# Patient Record
Sex: Female | Born: 1950 | Race: Black or African American | Hispanic: No | Smoking: Former smoker
Health system: Southern US, Community
[De-identification: ages and names within clinical notes are randomized; demographics above are authoritative.]

## PROBLEM LIST (undated history)

## (undated) DIAGNOSIS — Z923 Personal history of irradiation: Secondary | ICD-10-CM

## (undated) DIAGNOSIS — K219 Gastro-esophageal reflux disease without esophagitis: Secondary | ICD-10-CM

## (undated) DIAGNOSIS — Z9889 Other specified postprocedural states: Secondary | ICD-10-CM

## (undated) DIAGNOSIS — F419 Anxiety disorder, unspecified: Secondary | ICD-10-CM

## (undated) DIAGNOSIS — E785 Hyperlipidemia, unspecified: Secondary | ICD-10-CM

## (undated) DIAGNOSIS — M1990 Unspecified osteoarthritis, unspecified site: Secondary | ICD-10-CM

## (undated) DIAGNOSIS — C50919 Malignant neoplasm of unspecified site of unspecified female breast: Secondary | ICD-10-CM

## (undated) DIAGNOSIS — H269 Unspecified cataract: Secondary | ICD-10-CM

## (undated) DIAGNOSIS — R112 Nausea with vomiting, unspecified: Secondary | ICD-10-CM

## (undated) DIAGNOSIS — Z9221 Personal history of antineoplastic chemotherapy: Secondary | ICD-10-CM

## (undated) DIAGNOSIS — R05 Cough: Secondary | ICD-10-CM

## (undated) DIAGNOSIS — G4730 Sleep apnea, unspecified: Secondary | ICD-10-CM

## (undated) DIAGNOSIS — T7840XA Allergy, unspecified, initial encounter: Secondary | ICD-10-CM

## (undated) DIAGNOSIS — I10 Essential (primary) hypertension: Secondary | ICD-10-CM

## (undated) HISTORY — DX: Unspecified cataract: H26.9

## (undated) HISTORY — PX: COLONOSCOPY: SHX174

## (undated) HISTORY — PX: BACK SURGERY: SHX140

## (undated) HISTORY — DX: Sleep apnea, unspecified: G47.30

## (undated) HISTORY — DX: Essential (primary) hypertension: I10

## (undated) HISTORY — PX: BREAST BIOPSY: SHX20

## (undated) HISTORY — DX: Malignant neoplasm of unspecified site of unspecified female breast: C50.919

## (undated) HISTORY — DX: Anxiety disorder, unspecified: F41.9

## (undated) HISTORY — DX: Hyperlipidemia, unspecified: E78.5

## (undated) HISTORY — DX: Gastro-esophageal reflux disease without esophagitis: K21.9

## (undated) HISTORY — PX: EYE SURGERY: SHX253

## (undated) HISTORY — DX: Unspecified osteoarthritis, unspecified site: M19.90

## (undated) HISTORY — DX: Allergy, unspecified, initial encounter: T78.40XA

---
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
## 1980-03-25 HISTORY — PX: SALPINGOOPHORECTOMY: SHX82

## 1980-03-25 HISTORY — PX: APPENDECTOMY: SHX54

## 1993-03-25 HISTORY — PX: OTHER SURGICAL HISTORY: SHX169

## 1994-03-25 HISTORY — PX: OTHER SURGICAL HISTORY: SHX169

## 1998-03-25 DIAGNOSIS — C50919 Malignant neoplasm of unspecified site of unspecified female breast: Secondary | ICD-10-CM

## 1998-03-25 DIAGNOSIS — Z923 Personal history of irradiation: Secondary | ICD-10-CM

## 1998-03-25 DIAGNOSIS — Z9221 Personal history of antineoplastic chemotherapy: Secondary | ICD-10-CM

## 1998-03-25 HISTORY — DX: Personal history of irradiation: Z92.3

## 1998-03-25 HISTORY — DX: Personal history of antineoplastic chemotherapy: Z92.21

## 1998-03-25 HISTORY — DX: Malignant neoplasm of unspecified site of unspecified female breast: C50.919

## 1999-03-26 HISTORY — PX: BREAST LUMPECTOMY: SHX2

## 2000-07-01 ENCOUNTER — Other Ambulatory Visit: Admission: RE | Admit: 2000-07-01 | Discharge: 2000-07-01 | Payer: Self-pay | Admitting: Obstetrics and Gynecology

## 2002-07-12 ENCOUNTER — Other Ambulatory Visit: Admission: RE | Admit: 2002-07-12 | Discharge: 2002-07-12 | Payer: Self-pay | Admitting: Obstetrics and Gynecology

## 2006-03-25 HISTORY — PX: LUMBAR DISC SURGERY: SHX700

## 2006-11-20 ENCOUNTER — Ambulatory Visit (HOSPITAL_COMMUNITY): Admission: RE | Admit: 2006-11-20 | Discharge: 2006-11-21 | Payer: Self-pay | Admitting: Specialist

## 2007-04-13 ENCOUNTER — Emergency Department (HOSPITAL_COMMUNITY): Admission: EM | Admit: 2007-04-13 | Discharge: 2007-04-13 | Payer: Self-pay | Admitting: Emergency Medicine

## 2007-08-26 ENCOUNTER — Other Ambulatory Visit: Admission: RE | Admit: 2007-08-26 | Discharge: 2007-08-26 | Payer: Self-pay | Admitting: Obstetrics and Gynecology

## 2009-03-24 ENCOUNTER — Emergency Department (HOSPITAL_COMMUNITY): Admission: EM | Admit: 2009-03-24 | Discharge: 2009-03-24 | Payer: Self-pay | Admitting: Emergency Medicine

## 2010-08-07 NOTE — Op Note (Signed)
Jill Mcguire, Jill Mcguire            ACCOUNT NO.:  [id number]   MEDICAL RECORD NO.:  [id number]          PATIENT TYPE:  AMB   LOCATION:  DAY                          FACILITY:  [facility]   PHYSICIAN:  Jene Every, M.D.    DATE OF BIRTH:  27-Feb-1951   DATE OF PROCEDURE:  11/20/2006  DATE OF DISCHARGE:                               OPERATIVE REPORT   PREOPERATIVE DIAGNOSIS:  Spinal stenosis, herniated nucleus pulposus, L5-  S1 right.   POSTOPERATIVE DIAGNOSIS:  Spinal stenosis, herniated nucleus pulposus,  L5-S1 right.   PROCEDURE PERFORMED:  Lateral recess decompression and foraminotomy of  S1, microdiskectomy L5-S1.   ASSISTANT:  Roma Schanz, P.A.   BRIEF HISTORY AND INDICATION:  A 60 year old with right lower extremity  radicular pain in an S1 nerve root distribution secondary to HNP and  stenosis compressing the S1 nerve root.  It was refractory to  conservative treatment.  Positive neural tension signs, diminished  plantar flexion, indicated for decompression of the S1 nerve root.  Risks and benefits discussed including bleeding, infection, damage to  neurovascular structures, CSF leakage, epidural fibrosis, adjacent  segment disease, need for fusion in the future, anesthetic  complications, etc.   TECHNIQUE:  The patient placed in the in supine position.  After the  induction of adequate anesthesia and 1 g Kefzol, she was placed prone on  the May frame, all bony prominences well-padded.  The lumbar region  is prepped and draped in the usual sterile fashion.  One 18-gauge spinal  needle utilized to localize the L5-S1 interspace, confirmed with x-ray.  Incision was made from the spinous process of L5 to S1.  Subcutaneous  tissue was dissected.  Electrocautery was utilized to achieve  hemostasis.  The dorsal lumbar fascia identified and divided in the line  of the skin incision.  Paraspinous muscle elevated from the lamina of L5  and S1.  Operating microscope was draped  and brought in the surgical  field.   Confirmatory radiograph obtained with Penfield #4 in the interlaminar  space.  Hemilaminotomy of the caudad edge of L5 was performed with 2 and  3-mm Kerrison, detaching the ligamentum flavum.  A curette utilized to  detach the ligamentum flavum from the cephalad edge of S1.  Hemilaminotomy was then performed there with a foraminotomy of S1.  The  nerve root of S1 was identified and found to be compressing the lateral  recess, multifactorial.  We decompressed the lateral recess to the  medial border of the pedicle utilizing a 2-mm Kerrison.  Then gently  mobilized the nerve root medially, identified a focal HNP, performed an  annulotomy and a copious portion of disk material was removed from the  disk space with straight and upbiting pituitaries and further mobilized  with a nerve hook hockey stick probe.  Following the full diskectomy of  herniated material, there was good decompression of the nerve root with  at least a centimeter of excursion medial to the pedicle without  difficulty.  I then checked the foramen of L5, S1, S1, passed the hockey  stick freely beneath the thecal sac, the axilla and  the shoulder of the  root without residual disk herniation causing compression.  Disk space  copiously irrigated with antibiotic irrigation.  Inspection revealed no  evidence of CSF leakage or active bleeding.  McCullough retractor was  removed.  Paraspinous muscle inspected with no evidence of active  bleeding.  The  dorsal lumbar fascia reapproximated with #1 Vicryl  interrupted figure-of-eight sutures, the subcutaneous tissue  reapproximated with 2-0 Vicryl simple suture the skin was reapproximated  with 4-0 subcuticular Prolene and reinforced with Steri-Strips.  Sterile  dressing applied.  Placed supine on the hospital bed, extubated without difficulty and  transported to the recovery room in satisfactory condition.   The patient tolerated the  procedure well with no complications.      Jene Every, M.D.  Electronically Signed     JB/MEDQ  D:  11/20/2006  T:  11/21/2006  Job:  [id number]

## 2010-10-17 ENCOUNTER — Encounter: Payer: Self-pay | Admitting: Internal Medicine

## 2010-10-25 ENCOUNTER — Ambulatory Visit (AMBULATORY_SURGERY_CENTER): Payer: BC Managed Care – PPO | Admitting: *Deleted

## 2010-10-25 ENCOUNTER — Telehealth: Payer: Self-pay | Admitting: *Deleted

## 2010-10-25 VITALS — Ht 67.0 in | Wt 181.4 lb

## 2010-10-25 DIAGNOSIS — Z86010 Personal history of colonic polyps: Secondary | ICD-10-CM

## 2010-10-25 DIAGNOSIS — Z1211 Encounter for screening for malignant neoplasm of colon: Secondary | ICD-10-CM

## 2010-10-25 MED ORDER — PEG-KCL-NACL-NASULF-NA ASC-C 100 G PO SOLR: ORAL | Status: DC

## 2010-10-25 NOTE — Progress Notes (Signed)
Pt scheduled for colonoscopy Thursday 11/08/2010 with Dr. Juanda Chance.. She had previous colonoscopy procedures at [facility] in 2001 and 2006. She thinks she had polyps in 2001 but not in 2006.  Pt does not remember MD name that performed procedures.  Release of information form signed and given to Ronny Bacon, CMA. Ezra Sites

## 2010-10-25 NOTE — Telephone Encounter (Signed)
Pt scheduled for colonoscopy Thursday 11/08/2010 with Dr. Brodie.. She had previous colonoscopy procedures at [facility] in 2001 and 2006. She thinks she had polyps in 2001 but not in 2006.  Pt does not remember MD name that performed procedures.  Release of information form signed and given to Dottie Lener Ventresca, CMA. Kieryn Burtis  

## 2010-11-08 ENCOUNTER — Encounter: Payer: Self-pay | Admitting: Internal Medicine

## 2010-11-08 ENCOUNTER — Ambulatory Visit (AMBULATORY_SURGERY_CENTER): Payer: BC Managed Care – PPO | Admitting: Internal Medicine

## 2010-11-08 DIAGNOSIS — D128 Benign neoplasm of rectum: Secondary | ICD-10-CM

## 2010-11-08 DIAGNOSIS — Z1211 Encounter for screening for malignant neoplasm of colon: Secondary | ICD-10-CM

## 2010-11-08 DIAGNOSIS — D129 Benign neoplasm of anus and anal canal: Secondary | ICD-10-CM

## 2010-11-08 DIAGNOSIS — Z86010 Personal history of colonic polyps: Secondary | ICD-10-CM

## 2010-11-08 DIAGNOSIS — D126 Benign neoplasm of colon, unspecified: Secondary | ICD-10-CM

## 2010-11-08 MED ORDER — SODIUM CHLORIDE 0.9 % IV SOLN: 500.0000 mL | INTRAVENOUS | Status: DC

## 2010-11-08 NOTE — Patient Instructions (Signed)
See blue and green sheets for additional d/c instructions 

## 2010-11-09 ENCOUNTER — Telehealth: Payer: Self-pay | Admitting: *Deleted

## 2010-11-09 NOTE — Telephone Encounter (Signed)
No ID on voice mail.  No message left per protocol.

## 2010-11-15 ENCOUNTER — Encounter: Payer: Self-pay | Admitting: Internal Medicine

## 2010-11-21 ENCOUNTER — Other Ambulatory Visit: Payer: Self-pay | Admitting: Obstetrics and Gynecology

## 2010-11-21 ENCOUNTER — Other Ambulatory Visit (HOSPITAL_COMMUNITY)
Admission: RE | Admit: 2010-11-21 | Discharge: 2010-11-21 | Disposition: A | Discharge status: Home / Self Care | Payer: BC Managed Care – PPO | Source: Ambulatory Visit | Attending: Obstetrics and Gynecology | Admitting: Obstetrics and Gynecology

## 2010-11-21 DIAGNOSIS — Z01419 Encounter for gynecological examination (general) (routine) without abnormal findings: Secondary | ICD-10-CM | POA: Insufficient documentation

## 2010-12-14 LAB — HIV ANTIBODY (ROUTINE TESTING W REFLEX): HIV: NONREACTIVE

## 2010-12-14 LAB — ALT: ALT: 16

## 2010-12-14 LAB — HEPATITIS C ANTIBODY: HCV Ab: NEGATIVE

## 2010-12-14 LAB — HEPATITIS B SURFACE ANTIGEN: Hepatitis B Surface Ag: NEGATIVE

## 2010-12-14 LAB — AST: AST: 19

## 2011-01-04 LAB — PROTIME-INR
INR: 0.9
Prothrombin Time: 12.7

## 2011-01-04 LAB — HEMOGLOBIN AND HEMATOCRIT, BLOOD
HCT: 39.1
Hemoglobin: 13.2

## 2011-01-04 LAB — APTT: aPTT: 26

## 2012-07-02 DIAGNOSIS — C50811 Malignant neoplasm of overlapping sites of right female breast: Secondary | ICD-10-CM | POA: Insufficient documentation

## 2012-07-02 DIAGNOSIS — Z170 Estrogen receptor positive status [ER+]: Secondary | ICD-10-CM | POA: Insufficient documentation

## 2012-09-03 ENCOUNTER — Other Ambulatory Visit: Payer: Self-pay | Admitting: Obstetrics and Gynecology

## 2012-10-14 ENCOUNTER — Other Ambulatory Visit (HOSPITAL_COMMUNITY)
Admission: RE | Admit: 2012-10-14 | Discharge: 2012-10-14 | Disposition: A | Discharge status: Home / Self Care | Payer: BC Managed Care – PPO | Source: Ambulatory Visit | Attending: Obstetrics and Gynecology | Admitting: Obstetrics and Gynecology

## 2012-10-14 ENCOUNTER — Ambulatory Visit (INDEPENDENT_AMBULATORY_CARE_PROVIDER_SITE_OTHER): Payer: BC Managed Care – PPO | Admitting: Obstetrics and Gynecology

## 2012-10-14 ENCOUNTER — Encounter: Payer: Self-pay | Admitting: Obstetrics and Gynecology

## 2012-10-14 VITALS — BP 130/68 | Ht 67.0 in | Wt 182.0 lb

## 2012-10-14 DIAGNOSIS — Z01419 Encounter for gynecological examination (general) (routine) without abnormal findings: Secondary | ICD-10-CM | POA: Insufficient documentation

## 2012-10-14 DIAGNOSIS — N812 Incomplete uterovaginal prolapse: Secondary | ICD-10-CM

## 2012-10-14 DIAGNOSIS — Z1212 Encounter for screening for malignant neoplasm of rectum: Secondary | ICD-10-CM

## 2012-10-14 DIAGNOSIS — Z1151 Encounter for screening for human papillomavirus (HPV): Secondary | ICD-10-CM | POA: Insufficient documentation

## 2012-10-14 LAB — HEMOCCULT GUIAC POC 1CARD (OFFICE): Fecal Occult Blood, POC: NEGATIVE

## 2012-10-14 NOTE — Progress Notes (Signed)
Patient ID: Jill Mcguire, female   DOB: 09/13/1950, 62 y.o.   MRN: [id number] Pt here today for an annual exam and questionable prolapse

## 2012-10-14 NOTE — Progress Notes (Signed)
  Assessment:  Normal Gyn Examsecond degreee ut descensus   Plan:  1. pap smear done, next pap due 3 yr 2. return 2-4 wk for pessary fitting delay surgery discussion til later, p retrirement  Subjective:  Jill Mcguire is a 62 y.o. female No obstetric history on file. who presents for annual exam.  Has noted firm mass at introitus. The patient has complaints today of ? Prolapse. Notes a bulge with wiping.hard, obstructs urethra.  Mild sui sx.  The following portions of the patient's history were reviewed and updated as appropriate: allergies, current medications, past family history, past medical history, past social history, past surgical history and problem list.  Review of Systems Pertinent items are noted in HPI.  Objective:  BP 130/68  Ht 5\' 7"  (1.702 m)  Wt 182 lb (82.555 kg)  BMI 28.5 kg/m2  BMI: Body mass index is 28.5 kg/(m^2). General Appearance: Alert, appropriate appearance for age. No acute distress HEENT: Grossly normal Neck / Thyroid:  Cardiovascular: RRR; normal S1, S2, no murmur Lungs: CTA bilaterally Back: No CVAT Breast Exam: No dimpling, nipple retraction or discharge. No masses or nodes., post radiation changes in rt breast, stable by pt hx and self chk  and No masses or nodes.No dimpling, nipple retraction or discharge. Gastrointestinal: Soft, non-tender, no masses or organomegaly Pelvic Exam: Vaginal: 2nd degree descensus to introitus . tiny cystocele , no rotation of urethra Cervix: normal appearance Adnexa: normal bimanual exam Uterus: anteverted and second degree descensus Rectovaginal: guaiac negative stool obtained and no rectocele Lymphatic Exam: Non-palpable nodes in neck, clavicular, axillary, or inguinal regions Skin: no rash or abnormalities Neurologic: Normal gait and speech, no tremor  Psychiatric: Alert and oriented, appropriate affect.  Urinalysis:Not done  Christin Bach. MD Pgr [phone] 10:27 AM

## 2012-10-14 NOTE — Patient Instructions (Signed)
Prolapse  Prolapse means the falling down, bulging, dropping, or drooping of a body part. Organs that commonly prolapse include the rectum, small intestine, bladder, urethra, vagina (birth canal), uterus (womb), and cervix. Prolapse occurs when the ligaments and muscle tissue around the rectum, bladder, and uterus are damaged or weakened.  CAUSES  This happens especially with:  Childbirth. Some women feel pelvic pressure or have trouble holding their urine right after childbirth, because of stretching and tearing of pelvic tissues. This generally gets better with time and the feeling usually goes away, but it may return with aging.  Chronic heavy lifting.  Aging.  Menopause, with loss of estrogen production weakening the pelvic ligaments and muscles.  Past pelvic surgery.  Obesity.  Chronic constipation.  Chronic cough. Prolapse may affect a single organ, or several organs may prolapse at the same time. The front wall of the vagina holds up the bladder. The back wall holds up part of the lower intestine, or rectum. The uterus fills a spot in the middle. All these organs can be involved when the ligaments and muscles around the vagina relax too much. This often gets worse when women stop producing estrogen (menopause). SYMPTOMS  Uncontrolled loss of urine (incontinence) with cough, sneeze, straining, and exercise.  More force may be required to have a bowel movement, due to trapping of the stool.  When part of an organ bulges through the opening of the vagina, there is sometimes a feeling of heaviness or pressure. It may feel as though something is falling out. This sensation increases with coughing or bearing down.  If the organs protrude through the opening of the vagina and rub against the clothing, there may be soreness, ulcers, infection, pain, and bleeding.  Lower back pain.  Pushing in the upper or lower part of the vagina, to pass urine or have a bowel movement.  Problems  having sexual intercourse.  Being unable to insert a tampon or applicator. DIAGNOSIS  Usually, a physical exam is all that is needed to identify the problem. During the examination, you may be asked to cough and strain while lying down, sitting up, and standing up. Your caregiver will determine if more testing is required, such as bladder function tests. Some diagnoses are:  Cystocele: Bulging and falling of the bladder into the top of the vagina.  Rectocele: Part of the rectum bulging into the vagina.  Prolapse of the uterus: The uterus falls or drops into the vagina.  Enterocele: Bulging of the top of the vagina, after a hysterectomy (uterus removal), with the small intestine bulging into the vagina. A hernia in the top of the vagina.  Urethrocele: The urethra (urine carrying tube) bulging into the vagina. TREATMENT  In most cases, prolapse needs to be treated only if it produces symptoms. If the symptoms are interfering with your usual daily or sexual activities, treatment may be necessary. The following are some measures that may be used to treat prolapse.  Estrogen may help elderly women with mild prolapse.  Kegel exercises may help mild cases of prolapse, by strengthening and tightening the muscles of the pelvic floor.  Pessaries are used in women who choose not to, or are unable to, have surgery. A pessary is a doughnut-shaped piece of plastic or rubber that is put into the vagina to keep the organs in place. This device must be fitted by your caregiver. Your caregiver will also explain how to care for yourself with the pessary. If it works well for you,   this may be the only treatment required.  Surgery is often the only form of treatment for more severe prolapses. There are different types of surgery available. You should discuss what the best procedure is for you. If the uterus is prolapsed, it may be removed (hysterectomy) as part of the surgical treatment. Your caregiver will  discuss the risks and benefits with you.  Uterine-vaginal suspension (surgery to hold up the organs) may be used, especially if you want to maintain your fertility. No form of treatment is guaranteed to correct the prolapse or relieve the symptoms. HOME CARE INSTRUCTIONS   Wear a sanitary pad or absorbent product if you have incontinence of urine.  Avoid heavy lifting and straining with exercise and work.  Take over-the-counter pain medicine for minor discomfort.  Try taking estrogen or using estrogen vaginal cream.  Try Kegel exercises or use a pessary, before deciding to have surgery.  Do Kegel exercises after having a baby. SEEK MEDICAL CARE IF:   Your symptoms interfere with your daily activities.  You need medicine to help with the discomfort.  You need to be fitted with a pessary.  You notice bleeding from the vagina.  You think you have ulcers or you notice ulcers on the cervix.  You have an oral temperature above 102 F (38.9 C).  You develop pain or blood with urination.  You have bleeding with a bowel movement.  The symptoms are interfering with your sex life.  You have urinary incontinence that interferes with your daily activities.  You lose urine with sexual intercourse.  You have a chronic cough.  You have chronic constipation. Document Released: 09/15/2002 Document Revised: 06/03/2011 Document Reviewed: 03/26/2009 ExitCare Patient Information 2014 [employer].  

## 2012-11-10 ENCOUNTER — Encounter: Payer: Self-pay | Admitting: Obstetrics and Gynecology

## 2012-11-10 ENCOUNTER — Ambulatory Visit: Payer: BC Managed Care – PPO | Admitting: Obstetrics and Gynecology

## 2012-11-10 VITALS — BP 144/60 | Ht 67.0 in | Wt 182.6 lb

## 2012-11-10 DIAGNOSIS — N812 Incomplete uterovaginal prolapse: Secondary | ICD-10-CM

## 2012-11-10 DIAGNOSIS — Z4689 Encounter for fitting and adjustment of other specified devices: Secondary | ICD-10-CM

## 2012-11-10 MED ORDER — GELLHORN PESSARY MISC: 1.0000 | Status: DC

## 2012-11-10 MED ORDER — ESTROGENS, CONJUGATED 0.625 MG/GM VA CREA: TOPICAL_CREAM | VAGINAL | Status: DC

## 2012-11-10 NOTE — Progress Notes (Addendum)
Patient ID: Jill Mcguire, female   DOB: 28-Nov-1950, 62 y.o.   MRN: [id number] Pt here today for a pessary fitting. Fitted with Gelhorn 1 .75 pessary with drain, after trying ring pessaries.  Rx sent by pt to [facility]. Followup one week for insertion

## 2012-11-20 ENCOUNTER — Encounter: Payer: Self-pay | Admitting: Obstetrics and Gynecology

## 2012-11-20 ENCOUNTER — Ambulatory Visit (INDEPENDENT_AMBULATORY_CARE_PROVIDER_SITE_OTHER): Payer: BC Managed Care – PPO | Admitting: Obstetrics and Gynecology

## 2012-11-20 VITALS — BP 136/80 | Ht 67.0 in | Wt 181.0 lb

## 2012-11-20 DIAGNOSIS — N812 Incomplete uterovaginal prolapse: Secondary | ICD-10-CM

## 2012-11-20 NOTE — Progress Notes (Signed)
Patient ID: Jill Mcguire, female   DOB: Mar 23, 1951, 62 y.o.   MRN: [id number] Pt here today for pessary insertion.  pT brings her Gellhorn 1.75 pessary to office for insertion.  Inserted without probs.  Pt comfortable. A Pessary insertion Plan:  rechk 4 wk.            Premarin VC weekly

## 2012-11-29 ENCOUNTER — Encounter: Payer: Self-pay | Admitting: Obstetrics and Gynecology

## 2012-11-30 ENCOUNTER — Telehealth: Payer: Self-pay | Admitting: Obstetrics and Gynecology

## 2012-12-02 NOTE — Telephone Encounter (Signed)
Unable to reach pt at number that was left.

## 2012-12-21 ENCOUNTER — Ambulatory Visit (INDEPENDENT_AMBULATORY_CARE_PROVIDER_SITE_OTHER): Payer: BC Managed Care – PPO | Admitting: Obstetrics and Gynecology

## 2012-12-21 ENCOUNTER — Other Ambulatory Visit: Payer: Self-pay | Admitting: Obstetrics and Gynecology

## 2012-12-21 ENCOUNTER — Other Ambulatory Visit: Payer: BC Managed Care – PPO

## 2012-12-21 ENCOUNTER — Encounter: Payer: Self-pay | Admitting: Obstetrics and Gynecology

## 2012-12-21 ENCOUNTER — Ambulatory Visit (INDEPENDENT_AMBULATORY_CARE_PROVIDER_SITE_OTHER): Payer: BC Managed Care – PPO

## 2012-12-21 VITALS — BP 150/70 | Ht 67.0 in | Wt 182.2 lb

## 2012-12-21 DIAGNOSIS — Z01818 Encounter for other preprocedural examination: Secondary | ICD-10-CM

## 2012-12-21 DIAGNOSIS — N816 Rectocele: Secondary | ICD-10-CM

## 2012-12-21 DIAGNOSIS — N814 Uterovaginal prolapse, unspecified: Secondary | ICD-10-CM

## 2012-12-21 DIAGNOSIS — N8111 Cystocele, midline: Secondary | ICD-10-CM

## 2012-12-21 DIAGNOSIS — D259 Leiomyoma of uterus, unspecified: Secondary | ICD-10-CM

## 2012-12-21 NOTE — Patient Instructions (Addendum)
Hysterectomy °Care After °Refer to this sheet in the next few weeks. These instructions provide you with information on caring for yourself after your procedure. Your caregiver may also give you more specific instructions. Your treatment has been planned according to current medical practices, but problems sometimes occur. Call your caregiver if you have any problems or questions after your procedure. °HOME CARE INSTRUCTIONS  °Healing will take time. You may have discomfort, tenderness, swelling, and bruising at the surgical site for about 2 weeks. This is normal and will get better as time goes on. °· Only take over-the-counter or prescription medicines for pain, discomfort, or fever as directed by your caregiver. °· Do not take aspirin. It can cause bleeding. °· Do not drive when taking pain medicine. °· Follow your caregiver's advice regarding exercise, lifting, driving, and general activities. °· Resume your usual diet as directed and allowed. °· Get plenty of rest and sleep. °· Do not douche, use tampons, or have sexual intercourse for at least 6 weeks or until your caregiver gives you permission. °· Change your bandages (dressings) as directed by your caregiver. °· Monitor your temperature. °· Take showers instead of baths for 2 to 3 weeks. °· Do not drink alcohol until your caregiver gives you permission. °· If you are constipated, you may take a mild laxative with your caregiver's permission. Bran foods may help with constipation problems. Drinking enough fluids to keep your urine clear or pale yellow may help as well. °· Try to have someone home with you for 1 or 2 weeks to help around the house. °· Keep all of your follow-up appointments as directed by your caregiver. °SEEK MEDICAL CARE IF:  °· You have swelling, redness, or increasing pain in the surgical cut (incision) area. °· You have pus coming from the incision. °· You notice a bad smell coming from the incision or dressing. °· You have swelling,  redness, or pain around the intravenous (IV) site. °· Your incision breaks open. °· You feel dizzy or lightheaded. °· You have pain or bleeding when you urinate. °· You have persistent diarrhea. °· You have persistent nausea and vomiting. °· You have abnormal vaginal discharge. °· You have a rash. °· You have any type of abnormal reaction or develop an allergy to your medicine. °· Your pain is not controlled with your prescribed medicine. °SEEK IMMEDIATE MEDICAL CARE IF:  °· You have a fever. °· You have severe abdominal pain. °· You have chest pain. °· You have shortness of breath. °· You faint. °· You have pain, swelling, or redness of your leg. °· You have heavy vaginal bleeding with blood clots. °MAKE SURE YOU: °· Understand these instructions. °· Will watch your condition. °· Will get help right away if you are not doing well or get worse. °Document Released: 09/28/2004 Document Revised: 06/03/2011 Document Reviewed: 10/26/2010 °ExitCare® Patient Information ©2014 [employer]. ° °

## 2012-12-21 NOTE — Progress Notes (Signed)
   [facility] Visit  Patient name: Jill Mcguire MRN [id number]  Date of birth: 1950-04-02  CC & HPI:  Jill Mcguire is a 62 y.o. 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  Pertinent History Reviewed:  Medical & Surgical Hx:  Reviewed: Significant for HTN, HYPERCHOLESTEROLEMIA, VAGINAL ATROPHY,  Medications: Reviewed & Updated - see associated section Social History: Reviewed -  reports that she quit smoking about 4 years ago. She has never used smokeless tobacco.  Objective Findings:  Vitals: BP 150/70  Ht 5\' 7"  (1.702 m)  Wt 182 lb 3.2 oz (82.645 kg)  BMI 28.53 kg/m2  Physical Examination: General appearance - alert, well appearing, and in no distress, oriented to person, place, and time, normal appearing weight and well hydrated Mental status - alert, oriented to person, place, and time, normal mood, behavior, speech, dress, motor activity, and thought processes Eyes - pupils equal and reactive, extraocular eye movements intact Neck - supple, no significant adenopathy, NORMAL THyroid Chest - clear to auscultation, no wheezes, rales or rhonchi, symmetric air entry Abdomen - soft, nontender, nondistended, no masses or organomegaly Pelvic - VULVA: normal appearing vulva with no masses, tenderness or lesions, VAGINA: normal appearing vagina with normal color and discharge, no lesions, PELVIC FLOOR EXAM: cystocele 1, uterine descensus grade 2, CERVIX: normal appearing cervix without discharge or lesions, multiparous os, pap on record normal GC/CHl repeated , UTERUS: uterus is normal size, shape, consistency and nontender, mobile, second degreee descensus , ADNEXA: normal adnexa in size, nontender and no masses, RECTAL: normal rectal, no masses, guaiac  negative stool obtained, no rectocele, some laxity of introitus, a small posterior repair would improve pelvic support , exam chaperoned by office RN Rectal - guaiac negative stool obtained, no rectocele   Assessment & Plan:  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 Will obtain TV u/s to check endometrium, and adnexa, will do  Endo bx if endometrium >4 mm thickness. U/s shows 2.4 mm endometrial thickness. TRansvag u/s 3 pm today Decide on endom bx Dawn Little to arrange.

## 2012-12-22 LAB — GC/CHLAMYDIA PROBE AMP
CT Probe RNA: NEGATIVE
GC Probe RNA: NEGATIVE

## 2012-12-31 ENCOUNTER — Other Ambulatory Visit (HOSPITAL_COMMUNITY): Payer: BC Managed Care – PPO

## 2013-01-06 DIAGNOSIS — Z029 Encounter for administrative examinations, unspecified: Secondary | ICD-10-CM

## 2013-01-13 ENCOUNTER — Encounter (HOSPITAL_COMMUNITY): Payer: Self-pay | Admitting: Pharmacy Technician

## 2013-01-14 ENCOUNTER — Encounter: Payer: BC Managed Care – PPO | Admitting: Obstetrics and Gynecology

## 2013-01-20 ENCOUNTER — Ambulatory Visit (INDEPENDENT_AMBULATORY_CARE_PROVIDER_SITE_OTHER): Payer: BC Managed Care – PPO | Admitting: Obstetrics and Gynecology

## 2013-01-20 ENCOUNTER — Encounter: Payer: Self-pay | Admitting: Obstetrics and Gynecology

## 2013-01-20 ENCOUNTER — Encounter (HOSPITAL_COMMUNITY): Payer: Self-pay

## 2013-01-20 ENCOUNTER — Other Ambulatory Visit: Payer: Self-pay

## 2013-01-20 ENCOUNTER — Other Ambulatory Visit: Payer: Self-pay | Admitting: Obstetrics and Gynecology

## 2013-01-20 ENCOUNTER — Encounter (HOSPITAL_COMMUNITY)
Admission: RE | Admit: 2013-01-20 | Discharge: 2013-01-20 | Disposition: A | Discharge status: Home / Self Care | Payer: BC Managed Care – PPO | Source: Ambulatory Visit | Attending: Obstetrics and Gynecology | Admitting: Obstetrics and Gynecology

## 2013-01-20 VITALS — BP 128/72 | Ht 67.0 in | Wt 184.0 lb

## 2013-01-20 DIAGNOSIS — Z01812 Encounter for preprocedural laboratory examination: Secondary | ICD-10-CM | POA: Insufficient documentation

## 2013-01-20 DIAGNOSIS — N812 Incomplete uterovaginal prolapse: Secondary | ICD-10-CM

## 2013-01-20 DIAGNOSIS — Z01818 Encounter for other preprocedural examination: Secondary | ICD-10-CM | POA: Insufficient documentation

## 2013-01-20 LAB — BASIC METABOLIC PANEL
BUN: 10 mg/dL (ref 6–23)
CO2: 27 mEq/L (ref 19–32)
Calcium: 9.5 mg/dL (ref 8.4–10.5)
Chloride: 101 mEq/L (ref 96–112)
Creatinine, Ser: 0.9 mg/dL (ref 0.50–1.10)
GFR calc Af Amer: 77 mL/min — ABNORMAL LOW (ref >90)
GFR calc non Af Amer: 66 mL/min — ABNORMAL LOW (ref >90)
Glucose, Bld: 81 mg/dL (ref 70–99)
Potassium: 4.2 mEq/L (ref 3.5–5.1)
Sodium: 139 mEq/L (ref 135–145)

## 2013-01-20 LAB — URINALYSIS, ROUTINE W REFLEX MICROSCOPIC
Bilirubin Urine: NEGATIVE
Glucose, UA: NEGATIVE mg/dL
Hgb urine dipstick: NEGATIVE
Ketones, ur: NEGATIVE mg/dL
Leukocytes, UA: NEGATIVE
Nitrite: NEGATIVE
Protein, ur: NEGATIVE mg/dL
Specific Gravity, Urine: 1.015 (ref 1.005–1.030)
Urobilinogen, UA: 0.2 mg/dL (ref 0.0–1.0)
pH: 6.5 (ref 5.0–8.0)

## 2013-01-20 LAB — CBC
HCT: 38.9 % (ref 36.0–46.0)
Hemoglobin: 12.9 g/dL (ref 12.0–15.0)
MCH: 31.5 pg (ref 26.0–34.0)
MCHC: 33.2 g/dL (ref 30.0–36.0)
MCV: 94.9 fL (ref 78.0–100.0)
Platelets: 255 10*3/uL (ref 150–400)
RBC: 4.1 MIL/uL (ref 3.87–5.11)
RDW: 12.5 % (ref 11.5–15.5)
WBC: 4.5 10*3/uL (ref 4.0–10.5)

## 2013-01-20 NOTE — Patient Instructions (Addendum)
Your procedure is scheduled on: 01/26/2013  Report to [facility] at 6:15    AM.  Call this number if you have problems the morning of surgery: [phone]   Remember:   Do not drink or eat food:After Midnight.  :  Take these medicines the morning of surgery with A SIP OF WATER: Amlodipine                             Drink Magnesium Citrate as directed 01/25/2013   Do not wear jewelry, make-up or nail polish.  Do not wear lotions, powders, or perfumes. You may wear deodorant.  Do not shave 48 hours prior to surgery. Men may shave face and neck.  Do not bring valuables to the hospital.  Contacts, dentures or bridgework may not be worn into surgery.  Leave suitcase in the car. After surgery it may be brought to your room.  For patients admitted to the hospital, checkout time is 11:00 AM the day of discharge.   Patients discharged the day of surgery will not be allowed to drive home.    Special Instructions: Shower using CHG 2 nights before surgery and the night before surgery.  If you shower the day of surgery use CHG.  Use special wash - you have one bottle of CHG for all showers.  You should use approximately 1/3 of the bottle for each shower.   Please read over the following fact sheets that you were given: Pain Booklet, MRSA Information, Surgical Site Infection Prevention and Care and Recovery After Surgery  Hysterectomy Care After These instructions give you information on caring for yourself after your procedure. Your doctor may also give you more specific instructions. Call your doctor if you have any problems or questions after your procedure. HOME CARE Healing takes time. You may have discomfort, tenderness, puffiness (swelling), and bruising at the wound site. This may last for 2 weeks. This is normal and will get better.  Only take medicine as told by your doctor.  Do not take aspirin.  Do not drive when taking pain medicine.  Exercise, lift objects, drive, and get back to  daily activites as told by your doctor.  Get back to your normal diet and activities as told by your doctor.  Get plenty of rest and sleep.  Do not douche, use tampons, or have sex (intercourse) for at least 6 weeks or as told.  Change your bandages (dressings) as told by your doctor.  Take your temperature during the day.  Take showers for 2 to 3 weeks. Do not take baths.  Do not drink alcohol until your doctor says it is okay.  Take a medicine to help you poop (laxative) as told by your doctor. Try eating bran foods. Drink enough fluids to keep your pee (urine) clear or pale yellow.  Have someone help you at home for 1 to 2 weeks after your surgery.  Keep follow-up doctor visits as told. GET HELP RIGHT AWAY IF:   You have a fever.  You have bad belly (abdominal) pain.  You have chest pain.  You are short of breath.  You pass out (faint).  You have pain, puffiness, or redness of your leg.  You bleed a lot from your vagina and notice clumps of tissue (clots).  You have puffiness, redness, or pain where a tube was put in your vein (IV) or in the wound area.  You have yellowish-white fluid (pus)  coming from the wound.  You have a bad smell coming from the wound or bandage.  Your wound pulls apart.  You feel dizzy or lightheaded.  You have pain or bleeding when you pee.  You keep having watery poop (diarrhea).  You keep feeling sick to your stomach (nauseous) or keep throwing up (vomiting).  You have fluid (discharge) coming from your vagina.  You have a rash.  You have a reaction to your medicine.  You need stronger pain medicine. MAKE SURE YOU:  Understand these instructions.  Will watch your condition.  Will get help right away if you are not doing well or get worse. Document Released: 12/19/2007 Document Revised: 06/03/2011 Document Reviewed: 10/26/2010 [facility] Patient Information 2014 [place], [place]. Colporraphy, Anterior and Posterior  Repair Care After Please read the instructions outlined below and refer to this sheet in the next few weeks. These discharge instructions provide you with general information on caring for yourself after you leave the hospital. Your surgeon may also give you specific instructions. While your treatment has been planned according to the most current medical practices available, unavoidable complications occasionally occur. If you have any problems or questions after discharge, please call your surgeon. HOME CARE INSTRUCTIONS   Use all medicines as prescribed.  Do not lift more than ten pounds until you have healed or as instructed by your surgeon.  You may shower but do not take baths until instructed by your caregivers.  Avoid driving for 4 to 6 weeks following surgery or as instructed.  Use your elastic stockings during the day. You should wear the stockings for at least 2 weeks after discharge or longer if your ankles are swollen. The stockings help blood flow and help reduce swelling in the legs. It is easiest to put the stockings on before you get out of bed in the morning. They should fit snugly. PAIN CONTROL  If a prescription was given for a pain reliever, follow your surgeon's directions for taking it.  If the pain is not relieved by your medicine, becomes worse, or you have difficulty breathing, call your surgeon. ACTIVITY  Take frequent rest periods throughout the day.  Wait one week before returning to strenuous activities such as heavy lifting (more than 10 pounds), pushing or pulling.  Talk with your doctor about when you may return to work and your exercise routine.  You may shower as soon as directed by your caregiver after surgery. Pat incisions dry. Do not rub incisions with washcloth or towel.  Do not drive while taking prescription pain medication. NUTRITION  You may resume your normal diet.  Drink plenty of fluids (6-8 glasses a day).  Eat a well-balanced  diet.  Call your caregiver for persistent nausea or vomiting. ELIMINATION Your normal bowel function should return. If constipation should occur, you may:  Take a mild laxative.  Add fruit and bran to your diet.  Drink more fluids.  Call your caregiver if constipation is not relieved. SEEK IMMEDIATE MEDICAL CARE IF:   Increased bleeding (more than a small spot) from the vaginal area (opening to the birth canal).  Redness, swelling, or increasing pain in vaginal area.  Development of abdominal pain or pain which is getting worse rather than better.  Pus coming from wounds.  An unexplained temperature over 101 F (38.3 C) (orally).  A foul smell coming from your vaginal area.  Development of lightheadedness or feeling faint. Document Released: 09/27/2004 Document Revised: 06/03/2011 Document Reviewed: 12/30/2007 [employer] Patient Information 2014  [employer] PATIENT INSTRUCTIONS POST-ANESTHESIA  IMMEDIATELY FOLLOWING SURGERY:  Do not drive or operate machinery for the first twenty four hours after surgery.  Do not make any important decisions for twenty four hours after surgery or while taking narcotic pain medications or sedatives.  If you develop intractable nausea and vomiting or a severe headache please notify your doctor immediately.  FOLLOW-UP:  Please make an appointment with your surgeon as instructed. You do not need to follow up with anesthesia unless specifically instructed to do so.  WOUND CARE INSTRUCTIONS (if applicable):  Keep a dry clean dressing on the anesthesia/puncture wound site if there is drainage.  Once the wound has quit draining you may leave it open to air.  Generally you should leave the bandage intact for twenty four hours unless there is drainage.  If the epidural site drains for more than 36-48 hours please call the anesthesia department.  QUESTIONS?:  Please feel free to call your physician or the hospital operator if you have any questions,  and they will be happy to assist you.      `

## 2013-01-20 NOTE — Progress Notes (Signed)
.   Jill Mcguire is an 62 y.o. female. She is scheduled for vaginal hysterectomy anterior and posterior repair with removal of left tube and ovary on November 4. She is status post right salpingo-oophorectomy for previous dermoid surgery removal. She has second-degree uterine descensus. She denies stress incontinence. She does have adequate bowel movements without difficulty some posterior repair is being corrected to improve pelvic support Endovaginal ultrasound shows a 2.8 mm endometrial thickening so endometrial biopsy was not indicated. Discussed with patient, who accepts "I'm not good with pain".  Pertinent Gynecological History: Menses: post-menopausal Bleeding:  Contraception: post menopausal status DES exposure: unknown Blood transfusions: none Sexually transmitted diseases: no past history Previous GYN Procedures: Right salpingo-oophorectomy for dermoid  Last mammogram: normal Date: March 2014 [facility] Last pap: normal Date:  OB History: G3, P2   Menstrual History: Menarche age:  No LMP recorded. Patient is postmenopausal.    Past Medical History  Diagnosis Date  . Breast CA 2000    right breast  . Anxiety   . GERD (gastroesophageal reflux disease)   . Hypertension   . Hyperlipidemia     Past Surgical History  Procedure Laterality Date  . Breast lumpectomy  2001    lymph node dissection  . Appendectomy  1982  . Salpingoophorectomy  1982    right  . Anterior cervical laminectomy  1996  . Right eye muscel dissection  1995  . Lumbar disc surgery  2008    L4-L5    Family History  Problem Relation Age of Onset  . Diabetes Mother   . Hypertension Mother   . Stroke Mother     Social History:  reports that she quit smoking about 4 years ago. She has never used smokeless tobacco. She reports that she drinks alcohol. She reports that she does not use illicit drugs.  Allergies:  Allergies  Allergen Reactions  . Epinephrine     Rapid heart rate,  decreased BP  . Darvocet [Propoxyphene-Acetaminophen] Rash  . Tagamet [Cimetidine] Rash  . Tetanus Toxoids Rash     (Not in a hospital admission)  ROS  Blood pressure 128/72, height 5\' 7"  (1.702 m), weight 184 lb (83.462 kg). Physical Exam  Constitutional: She appears well-developed and well-nourished.  HENT:  Head: Normocephalic.  Eyes: Pupils are equal, round, and reactive to light.  Neck: Normal range of motion. Neck supple.  Cardiovascular: Normal rate.   Respiratory: Effort normal.  GI: Soft. Bowel sounds are normal. She exhibits no distension and no mass. There is no rebound and no guarding.  Genitourinary: Vagina normal.  Second-degree uterine descensus cervix at introitus. Good support beneath the urethra. Ultrasound shows normal small uterus retroverted with thin endometrium    No results found for this or any previous visit (from the past 24 hour(s)).  No results found.  Assessment/Plan: Second degree uterine descensus, generalized pelvic laxation scheduled for vaginal hysterectomy with left salpingo-oophorectomy and anterior and posterior repair.  Jill Mcguire 01/20/2013, 10:19 AM

## 2013-01-20 NOTE — Patient Instructions (Signed)
Reoperative labs this morning with a appointment at the hospital at 11 AM

## 2013-01-23 LAB — TYPE AND SCREEN
ABO/RH(D): B POS
Antibody Screen: NEGATIVE

## 2013-01-26 ENCOUNTER — Encounter (HOSPITAL_COMMUNITY): Payer: Self-pay | Admitting: *Deleted

## 2013-01-26 ENCOUNTER — Observation Stay (HOSPITAL_COMMUNITY)
Admission: RE | Admit: 2013-01-26 | Discharge: 2013-01-27 | Disposition: A | Discharge status: Home / Self Care | Payer: BC Managed Care – PPO | Source: Ambulatory Visit | Attending: Obstetrics and Gynecology | Admitting: Obstetrics and Gynecology

## 2013-01-26 ENCOUNTER — Encounter (HOSPITAL_COMMUNITY)
Admission: RE | Disposition: A | Discharge status: Home / Self Care | Payer: Self-pay | Source: Ambulatory Visit | Attending: Obstetrics and Gynecology

## 2013-01-26 ENCOUNTER — Encounter (HOSPITAL_COMMUNITY): Payer: BC Managed Care – PPO | Admitting: Certified Registered"

## 2013-01-26 ENCOUNTER — Inpatient Hospital Stay (HOSPITAL_COMMUNITY): Payer: BC Managed Care – PPO | Admitting: Certified Registered"

## 2013-01-26 DIAGNOSIS — D252 Subserosal leiomyoma of uterus: Secondary | ICD-10-CM

## 2013-01-26 DIAGNOSIS — D251 Intramural leiomyoma of uterus: Secondary | ICD-10-CM

## 2013-01-26 DIAGNOSIS — N8110 Cystocele, unspecified: Secondary | ICD-10-CM | POA: Diagnosis present

## 2013-01-26 DIAGNOSIS — D250 Submucous leiomyoma of uterus: Secondary | ICD-10-CM

## 2013-01-26 DIAGNOSIS — N812 Incomplete uterovaginal prolapse: Secondary | ICD-10-CM

## 2013-01-26 DIAGNOSIS — D259 Leiomyoma of uterus, unspecified: Principal | ICD-10-CM | POA: Insufficient documentation

## 2013-01-26 DIAGNOSIS — N8111 Cystocele, midline: Secondary | ICD-10-CM | POA: Insufficient documentation

## 2013-01-26 DIAGNOSIS — Z01812 Encounter for preprocedural laboratory examination: Secondary | ICD-10-CM | POA: Insufficient documentation

## 2013-01-26 DIAGNOSIS — N800 Endometriosis of uterus: Secondary | ICD-10-CM

## 2013-01-26 DIAGNOSIS — N814 Uterovaginal prolapse, unspecified: Secondary | ICD-10-CM | POA: Insufficient documentation

## 2013-01-26 HISTORY — PX: ANTERIOR AND POSTERIOR REPAIR: SHX5121

## 2013-01-26 HISTORY — PX: OOPHORECTOMY: SHX6387

## 2013-01-26 HISTORY — PX: VAGINAL HYSTERECTOMY: SHX2639

## 2013-01-26 SURGERY — HYSTERECTOMY, VAGINAL
Anesthesia: General | Site: Vagina | Wound class: Clean Contaminated

## 2013-01-26 MED ORDER — EPHEDRINE SULFATE 50 MG/ML IJ SOLN
INTRAMUSCULAR | Status: DC | PRN
  Administered 2013-01-26 (×2): 10 mg via INTRAVENOUS

## 2013-01-26 MED ORDER — HYDROMORPHONE 0.3 MG/ML IV SOLN
INTRAVENOUS | Status: AC
  Filled 2013-01-26: qty 25

## 2013-01-26 MED ORDER — KETOROLAC TROMETHAMINE 30 MG/ML IJ SOLN
30.0000 mg | Freq: Four times a day (QID) | INTRAMUSCULAR | Status: DC
  Filled 2013-01-26 (×2): qty 1

## 2013-01-26 MED ORDER — LIDOCAINE HCL 1 % IJ SOLN
INTRAMUSCULAR | Status: DC | PRN
  Administered 2013-01-26: 50 mg via INTRADERMAL

## 2013-01-26 MED ORDER — BUPIVACAINE-EPINEPHRINE 0.5% -1:200000 IJ SOLN
INTRAMUSCULAR | Status: DC | PRN
  Administered 2013-01-26: 25 mL

## 2013-01-26 MED ORDER — FENTANYL CITRATE 0.05 MG/ML IJ SOLN
INTRAMUSCULAR | Status: AC
  Filled 2013-01-26: qty 5

## 2013-01-26 MED ORDER — ONDANSETRON HCL 4 MG/2ML IJ SOLN
4.0000 mg | Freq: Four times a day (QID) | INTRAMUSCULAR | Status: DC | PRN
  Filled 2013-01-26 (×3): qty 2

## 2013-01-26 MED ORDER — ONDANSETRON HCL 4 MG PO TABS: 4.0000 mg | ORAL_TABLET | Freq: Four times a day (QID) | ORAL | Status: DC | PRN

## 2013-01-26 MED ORDER — PANTOPRAZOLE SODIUM 40 MG PO TBEC
40.0000 mg | DELAYED_RELEASE_TABLET | Freq: Every day | ORAL | Status: DC
  Administered 2013-01-26 – 2013-01-27 (×2): 40 mg via ORAL
  Filled 2013-01-26 (×2): qty 1

## 2013-01-26 MED ORDER — NALOXONE HCL 0.4 MG/ML IJ SOLN: 0.4000 mg | INTRAMUSCULAR | Status: DC | PRN

## 2013-01-26 MED ORDER — ROCURONIUM BROMIDE 50 MG/5ML IV SOLN
INTRAVENOUS | Status: AC
  Filled 2013-01-26: qty 1

## 2013-01-26 MED ORDER — STERILE WATER FOR IRRIGATION IR SOLN
Status: DC | PRN
  Administered 2013-01-26: 1000 mL

## 2013-01-26 MED ORDER — FENTANYL CITRATE 0.05 MG/ML IJ SOLN
25.0000 ug | INTRAMUSCULAR | Status: DC | PRN
  Administered 2013-01-26: 25 ug via INTRAVENOUS

## 2013-01-26 MED ORDER — PROPOFOL 10 MG/ML IV BOLUS
INTRAVENOUS | Status: DC | PRN
  Administered 2013-01-26: 150 mg via INTRAVENOUS

## 2013-01-26 MED ORDER — LIDOCAINE HCL (PF) 1 % IJ SOLN
INTRAMUSCULAR | Status: AC
  Filled 2013-01-26: qty 5

## 2013-01-26 MED ORDER — MIDAZOLAM HCL 2 MG/2ML IJ SOLN
1.0000 mg | INTRAMUSCULAR | Status: DC | PRN
  Administered 2013-01-26: 2 mg via INTRAVENOUS

## 2013-01-26 MED ORDER — MIDAZOLAM HCL 2 MG/2ML IJ SOLN
INTRAMUSCULAR | Status: AC
  Filled 2013-01-26: qty 2

## 2013-01-26 MED ORDER — KETOROLAC TROMETHAMINE 30 MG/ML IJ SOLN
INTRAMUSCULAR | Status: AC
  Filled 2013-01-26: qty 1

## 2013-01-26 MED ORDER — FENTANYL CITRATE 0.05 MG/ML IJ SOLN
INTRAMUSCULAR | Status: AC
  Filled 2013-01-26: qty 2

## 2013-01-26 MED ORDER — NEOSTIGMINE METHYLSULFATE 1 MG/ML IJ SOLN
INTRAMUSCULAR | Status: AC
  Filled 2013-01-26: qty 1

## 2013-01-26 MED ORDER — KETOROLAC TROMETHAMINE 30 MG/ML IJ SOLN
30.0000 mg | Freq: Four times a day (QID) | INTRAMUSCULAR | Status: DC
  Administered 2013-01-26 – 2013-01-27 (×4): 30 mg via INTRAVENOUS
  Filled 2013-01-26 (×2): qty 1

## 2013-01-26 MED ORDER — BUPIVACAINE-EPINEPHRINE PF 0.5-1:200000 % IJ SOLN
INTRAMUSCULAR | Status: AC
  Filled 2013-01-26: qty 20

## 2013-01-26 MED ORDER — LACTATED RINGERS IV SOLN
INTRAVENOUS | Status: DC
  Administered 2013-01-26: 14:00:00 via INTRAVENOUS
  Administered 2013-01-26 – 2013-01-27 (×2): 1000 mL via INTRAVENOUS

## 2013-01-26 MED ORDER — DOCUSATE SODIUM 100 MG PO CAPS
100.0000 mg | ORAL_CAPSULE | Freq: Two times a day (BID) | ORAL | Status: DC
  Administered 2013-01-26 – 2013-01-27 (×2): 100 mg via ORAL
  Filled 2013-01-26 (×2): qty 1

## 2013-01-26 MED ORDER — EPHEDRINE SULFATE 50 MG/ML IJ SOLN
INTRAMUSCULAR | Status: AC
  Filled 2013-01-26: qty 1

## 2013-01-26 MED ORDER — CEFAZOLIN SODIUM-DEXTROSE 2-3 GM-% IV SOLR
INTRAVENOUS | Status: AC
  Filled 2013-01-26: qty 50

## 2013-01-26 MED ORDER — SODIUM CHLORIDE 0.9 % IJ SOLN: 9.0000 mL | INTRAMUSCULAR | Status: DC | PRN

## 2013-01-26 MED ORDER — ZOLPIDEM TARTRATE 5 MG PO TABS
5.0000 mg | ORAL_TABLET | Freq: Every evening | ORAL | Status: DC | PRN
  Administered 2013-01-26: 5 mg via ORAL
  Filled 2013-01-26: qty 1

## 2013-01-26 MED ORDER — SIMVASTATIN 10 MG PO TABS
10.0000 mg | ORAL_TABLET | Freq: Every day | ORAL | Status: DC
  Administered 2013-01-26: 10 mg via ORAL
  Filled 2013-01-26: qty 1

## 2013-01-26 MED ORDER — GLYCOPYRROLATE 0.2 MG/ML IJ SOLN
INTRAMUSCULAR | Status: AC
  Filled 2013-01-26: qty 4

## 2013-01-26 MED ORDER — AMLODIPINE BESYLATE 5 MG PO TABS
10.0000 mg | ORAL_TABLET | Freq: Every day | ORAL | Status: DC
  Administered 2013-01-27: 10 mg via ORAL
  Filled 2013-01-26: qty 2

## 2013-01-26 MED ORDER — DIPHENHYDRAMINE HCL 12.5 MG/5ML PO ELIX: 12.5000 mg | ORAL_SOLUTION | Freq: Four times a day (QID) | ORAL | Status: DC | PRN

## 2013-01-26 MED ORDER — GLYCOPYRROLATE 0.2 MG/ML IJ SOLN
INTRAMUSCULAR | Status: DC | PRN
  Administered 2013-01-26: .8 mg via INTRAVENOUS

## 2013-01-26 MED ORDER — DIPHENHYDRAMINE HCL 50 MG/ML IJ SOLN: 12.5000 mg | Freq: Four times a day (QID) | INTRAMUSCULAR | Status: DC | PRN

## 2013-01-26 MED ORDER — ONDANSETRON HCL 4 MG/2ML IJ SOLN
INTRAMUSCULAR | Status: AC
  Filled 2013-01-26: qty 2

## 2013-01-26 MED ORDER — PROPOFOL 10 MG/ML IV BOLUS
INTRAVENOUS | Status: AC
  Filled 2013-01-26: qty 20

## 2013-01-26 MED ORDER — DEXAMETHASONE SODIUM PHOSPHATE 4 MG/ML IJ SOLN
4.0000 mg | Freq: Once | INTRAMUSCULAR | Status: AC
  Administered 2013-01-26: 4 mg via INTRAVENOUS

## 2013-01-26 MED ORDER — HYDROMORPHONE 0.3 MG/ML IV SOLN
INTRAVENOUS | Status: DC
  Administered 2013-01-26: 12:00:00 via INTRAVENOUS
  Administered 2013-01-26 – 2013-01-27 (×4): 0.3 mg via INTRAVENOUS
  Administered 2013-01-27: 0.6 mg via INTRAVENOUS

## 2013-01-26 MED ORDER — TRAMADOL HCL 50 MG PO TABS: 50.0000 mg | ORAL_TABLET | Freq: Four times a day (QID) | ORAL | Status: DC | PRN

## 2013-01-26 MED ORDER — LACTATED RINGERS IV SOLN
INTRAVENOUS | Status: DC | PRN
  Administered 2013-01-26 (×3): via INTRAVENOUS

## 2013-01-26 MED ORDER — NEOSTIGMINE METHYLSULFATE 1 MG/ML IJ SOLN
INTRAMUSCULAR | Status: DC | PRN
  Administered 2013-01-26: 5 mg via INTRAVENOUS

## 2013-01-26 MED ORDER — SIMETHICONE 80 MG PO CHEW: 80.0000 mg | CHEWABLE_TABLET | Freq: Four times a day (QID) | ORAL | Status: DC | PRN

## 2013-01-26 MED ORDER — ONDANSETRON HCL 4 MG/2ML IJ SOLN
4.0000 mg | Freq: Four times a day (QID) | INTRAMUSCULAR | Status: DC | PRN
  Administered 2013-01-26 – 2013-01-27 (×4): 4 mg via INTRAVENOUS
  Filled 2013-01-26: qty 2

## 2013-01-26 MED ORDER — ROCURONIUM BROMIDE 100 MG/10ML IV SOLN
INTRAVENOUS | Status: DC | PRN
  Administered 2013-01-26: 50 mg via INTRAVENOUS

## 2013-01-26 MED ORDER — 0.9 % SODIUM CHLORIDE (POUR BTL) OPTIME
TOPICAL | Status: DC | PRN
  Administered 2013-01-26: 1000 mL

## 2013-01-26 MED ORDER — LACTATED RINGERS IV SOLN
INTRAVENOUS | Status: DC
  Administered 2013-01-26: 1000 mL via INTRAVENOUS

## 2013-01-26 MED ORDER — DEXAMETHASONE SODIUM PHOSPHATE 4 MG/ML IJ SOLN
INTRAMUSCULAR | Status: AC
  Filled 2013-01-26: qty 1

## 2013-01-26 MED ORDER — ONDANSETRON HCL 4 MG/2ML IJ SOLN
4.0000 mg | Freq: Once | INTRAMUSCULAR | Status: AC
  Administered 2013-01-26: 4 mg via INTRAVENOUS

## 2013-01-26 MED ORDER — CEFAZOLIN SODIUM-DEXTROSE 2-3 GM-% IV SOLR
2.0000 g | INTRAVENOUS | Status: AC
  Administered 2013-01-26: 2 g via INTRAVENOUS

## 2013-01-26 MED ORDER — ONDANSETRON HCL 4 MG/2ML IJ SOLN: 4.0000 mg | Freq: Once | INTRAMUSCULAR | Status: DC | PRN

## 2013-01-26 MED ORDER — BUPROPION HCL ER (XL) 300 MG PO TB24
300.0000 mg | ORAL_TABLET | Freq: Every day | ORAL | Status: DC
  Administered 2013-01-26 – 2013-01-27 (×2): 300 mg via ORAL
  Filled 2013-01-26 (×4): qty 1

## 2013-01-26 MED ORDER — KETOROLAC TROMETHAMINE 30 MG/ML IJ SOLN
30.0000 mg | Freq: Once | INTRAMUSCULAR | Status: AC
  Administered 2013-01-26: 30 mg via INTRAVENOUS

## 2013-01-26 MED ORDER — FENTANYL CITRATE 0.05 MG/ML IJ SOLN
INTRAMUSCULAR | Status: DC | PRN
  Administered 2013-01-26 (×2): 50 ug via INTRAVENOUS
  Administered 2013-01-26: 150 ug via INTRAVENOUS
  Administered 2013-01-26: 100 ug via INTRAVENOUS

## 2013-01-26 SURGICAL SUPPLY — 50 items
APPLIER CLIP 13 LRG OPEN (CLIP)
BAG HAMPER (MISCELLANEOUS) ×4 IMPLANT
CLIP APPLIE 13 LRG OPEN (CLIP) IMPLANT
CLOTH BEACON ORANGE TIMEOUT ST (SAFETY) ×4 IMPLANT
COVER LIGHT HANDLE STERIS (MISCELLANEOUS) ×8 IMPLANT
COVER MAYO STAND XLG (DRAPE) ×4 IMPLANT
DECANTER SPIKE VIAL GLASS SM (MISCELLANEOUS) ×4 IMPLANT
DRAPE PROXIMA HALF (DRAPES) ×4 IMPLANT
DRAPE STERI URO 9X17 APER PCH (DRAPES) ×4 IMPLANT
DRAPE WARM FLUID 44X44 (DRAPE) ×4 IMPLANT
DRESSING TELFA 8X3 (GAUZE/BANDAGES/DRESSINGS) ×4 IMPLANT
ELECT REM PT RETURN 9FT ADLT (ELECTROSURGICAL) ×4
ELECTRODE REM PT RTRN 9FT ADLT (ELECTROSURGICAL) ×3 IMPLANT
FORMALIN 10 PREFIL 480ML (MISCELLANEOUS) ×4 IMPLANT
GAUZE PACKING 2X5 YD STERILE (GAUZE/BANDAGES/DRESSINGS) ×4 IMPLANT
GLOVE BIOGEL PI IND STRL 9 (GLOVE) ×3 IMPLANT
GLOVE BIOGEL PI INDICATOR 9 (GLOVE) ×1
GLOVE ECLIPSE 7.0 STRL STRAW (GLOVE) ×8 IMPLANT
GLOVE ECLIPSE 9.0 STRL (GLOVE) ×4 IMPLANT
GLOVE INDICATOR 6.5 STRL GRN (GLOVE) ×4 IMPLANT
GLOVE INDICATOR 7.0 STRL GRN (GLOVE) ×4 IMPLANT
GLOVE INDICATOR STER SZ 9 (GLOVE) ×4 IMPLANT
GOWN STRL REIN 3XL LVL4 (GOWN DISPOSABLE) ×4 IMPLANT
GOWN STRL REIN XL XLG (GOWN DISPOSABLE) ×8 IMPLANT
INST SET MAJOR GENERAL (KITS) ×4 IMPLANT
IV NS IRRIG 3000ML ARTHROMATIC (IV SOLUTION) ×4 IMPLANT
KIT ROOM TURNOVER AP CYSTO (KITS) ×4 IMPLANT
KIT ROOM TURNOVER APOR (KITS) ×4 IMPLANT
MANIFOLD NEPTUNE II (INSTRUMENTS) ×4 IMPLANT
NEEDLE HYPO 25X1 1.5 SAFETY (NEEDLE) IMPLANT
NS IRRIG 1000ML POUR BTL (IV SOLUTION) ×8 IMPLANT
PACK PERI GYN (CUSTOM PROCEDURE TRAY) ×4 IMPLANT
PAD ARMBOARD 7.5X6 YLW CONV (MISCELLANEOUS) ×4 IMPLANT
SET BASIN LINEN APH (SET/KITS/TRAYS/PACK) ×4 IMPLANT
SUT CHROMIC 0 CT 1 (SUTURE) ×36 IMPLANT
SUT CHROMIC 2 0 CT 1 (SUTURE) ×4 IMPLANT
SUT CHROMIC GUT BROWN 0 54 (SUTURE) IMPLANT
SUT CHROMIC GUT BROWN 0 54IN (SUTURE)
SUT MON AB 3-0 SH 27 (SUTURE) IMPLANT
SUT PROLENE 2 0 SH 30 (SUTURE) ×4 IMPLANT
SUT VIC AB 0 CT1 27 (SUTURE)
SUT VIC AB 0 CT1 27XBRD ANTBC (SUTURE) IMPLANT
SUT VIC AB 0 CT2 8-18 (SUTURE) ×4 IMPLANT
SUT VIC AB 0 CTX 36 (SUTURE)
SUT VIC AB 0 CTX36XBRD ANTBCTR (SUTURE) IMPLANT
SUT VICRYL 3 0 (SUTURE) IMPLANT
SYR CONTROL 10ML LL (SYRINGE) ×4 IMPLANT
TRAY FOLEY CATH 16FR SILVER (SET/KITS/TRAYS/PACK) ×4 IMPLANT
VERSALIGHT (MISCELLANEOUS) ×4 IMPLANT
WATER STERILE IRR 1000ML POUR (IV SOLUTION) ×4 IMPLANT

## 2013-01-26 NOTE — Preoperative (Signed)
Beta Blockers   Reason not to administer Beta Blockers:Not Applicable 

## 2013-01-26 NOTE — H&P (Signed)
Jill Mcguire is an 62 y.o. female. She is scheduled for vaginal hysterectomy anterior and posterior repair with removal of left tube and ovary on November 4. She is status post right salpingo-oophorectomy for previous dermoid surgery removal. She has second-degree uterine descensus. She denies stress incontinence. She does have adequate bowel movements without difficulty some posterior repair is being corrected to improve pelvic support  Endovaginal ultrasound shows a 2.8 mm endometrial thickening so endometrial biopsy was not indicated. Discussed with patient, who accepts "I'm not good with pain".  Pertinent Gynecological History:  Menses: post-menopausal  Bleeding:  Contraception: post menopausal status  DES exposure: unknown  Blood transfusions: none  Sexually transmitted diseases: no past history  Previous GYN Procedures: Right salpingo-oophorectomy for dermoid  Last mammogram: normal Date: March 2014 [facility]  Last pap: normal Date:  OB History: G3, P2  Menstrual History:  Menarche age:  No LMP recorded. Patient is postmenopausal.  Past Medical History   Diagnosis  Date   .  Breast CA  2000     right breast   .  Anxiety    .  GERD (gastroesophageal reflux disease)    .  Hypertension    .  Hyperlipidemia     Past Surgical History   Procedure  Laterality  Date   .  Breast lumpectomy   2001     lymph node dissection   .  Appendectomy   1982   .  Salpingoophorectomy   1982     right   .  Anterior cervical laminectomy   1996   .  Right eye muscel dissection   1995   .  Lumbar disc surgery   2008     L4-L5    Family History   Problem  Relation  Age of Onset   .  Diabetes  Mother    .  Hypertension  Mother    .  Stroke  Mother    Social History: reports that she quit smoking about 4 years ago. She has never used smokeless tobacco. She reports that she drinks alcohol. She reports that she does not use illicit drugs.  Allergies:  Allergies   Allergen   Reactions   .  Epinephrine      Rapid heart rate, decreased BP   .  Darvocet [Propoxyphene-Acetaminophen]  Rash   .  Tagamet [Cimetidine]  Rash   .  Tetanus Toxoids  Rash   (Not in a hospital admission)  ROS  Blood pressure 128/72, height 5\' 7"  (1.702 m), weight 184 lb (83.462 kg).  Physical Exam  Constitutional: She appears well-developed and well-nourished.  HENT:  Head: Normocephalic.  Eyes: Pupils are equal, round, and reactive to light.  Neck: Normal range of motion. Neck supple.  Cardiovascular: Normal rate.  Respiratory: Effort normal.  GI: Soft. Bowel sounds are normal. She exhibits no distension and no mass. There is no rebound and no guarding.  Genitourinary: Vagina normal.  Second-degree uterine descensus cervix at introitus. Good support beneath the urethra. Ultrasound shows normal small uterus retroverted with thin endometrium  No results found for this or any previous visit (from the past 24 hour(s)).  No results found.  CBC    Component Value Date/Time   WBC 4.5 01/20/2013 1105   RBC 4.10 01/20/2013 1105   HGB 12.9 01/20/2013 1105   HCT 38.9 01/20/2013 1105   PLT 255 01/20/2013 1105   MCV 94.9 01/20/2013 1105   MCH 31.5 01/20/2013 1105  MCHC 33.2 01/20/2013 1105   RDW 12.5 01/20/2013 1105   basic metabolic panel BMET    Component Value Date/Time   NA 139 01/20/2013 1105   K 4.2 01/20/2013 1105   CL 101 01/20/2013 1105   CO2 27 01/20/2013 1105   GLUCOSE 81 01/20/2013 1105   BUN 10 01/20/2013 1105   CREATININE 0.90 01/20/2013 1105   CALCIUM 9.5 01/20/2013 1105   GFRNONAA 66* 01/20/2013 1105   GFRAA 77* 01/20/2013 1105    B positive    Assessment/Plan:  Second degree uterine descensus, generalized pelvic laxation scheduled for vaginal hysterectomy with left salpingo-oophorectomy and anterior and posterior repair.   Risk of procedure reviewed with  Patient, including bleeding , infection,injury to adjacent organs, or need for further surgeries. Pt  acknowledges these inherent risks.  Taleen Prosser V  01/20/2013, 10:19 AM

## 2013-01-26 NOTE — Op Note (Signed)
Procedure: Vaginal hysterectomy left salpingo-oophorectomy, anterior and posterior repair Details of procedure: Patient was taken to the operating room prepped and draped for vaginal procedure with Ancef administered, timeout conducted and procedure confirmed by surgical team. Discussion regarding her epinephrine reaction 30 years ago was performed. A decision was made to use the epinephrine and Marcaine solution as per protocol with careful initial dosing. Next the posterior epithelium in the area of the old static laceration and episiotomy was infiltrated with 2 cc of Marcaine with epinephrine, and the patient observed for several minutes and no adverse reaction noted. Additional Marcaine was injected in the old obstetric episiotomy site which was opened up for improved vaginal access. Cystocele and uterus and cervix were easily accessible. Weighted speculum was inserted in the vagina. The cervix was quite short, the cervicovaginal junction was identified. Infiltrated with Marcaine with epinephrine, and the cervix circumscribed with a Bovie cautery. Bladder could be pushed upward dissection and the vesicovaginal avascular space identifying the anterior peritoneum and entering the peritoneum with ease Curved retractor was used to keep the bladder and urethra out of the surgical field. Posterior colpotomy incision was performed in the cul-de-sac easily entered as well. Uterosacral ligaments were clamped cut and suture ligated on either side and tagged for future identification using Zeppelin clamps, Mayo scissors and 0 chromics suture ligature. The lower cardinal ligaments on each side were identified clamped cut and suture ligated in small bites as was the upper cardinal ligaments. Probably it was then taken in small bites clamping cutting and suture ligated. Upon reaching the level of the round ligaments the pedicles were clamped cut and suture ligated, having place the vaginal tape and to hold the bowel well  out of the way. The specimens were tagged for future orientation. Adnexa were inspected and the left ovary identified easily. There was a minimal tissue to suggest fallopian tube on this side. Ovary was removed by clamping behind it with the Zeppelin clamp, remaining close to the ovary, doubly ligating the pedicle and excising the surgical specimen. Pedicles inspected and hemostasis confirmed. Foley catheter had been inserted by this time and drained 3-400 cc of clear urine. The uterosacral ligaments were placed on traction. A 2-0 Prolene suture used to to attach a portion of the cul-de-sac to the medial aspects of the uterosacral ligament on each side ,not crossing the midline ,to reduce potential for subsequent cul-de-sac descent or enterocele. Peritoneum over the bladder could then be tacked down to the cul-de-sac and then the 2-0 Prolene sutures tied down resulting in excellent support to the cul-de-sac. Sponge and needle counts were correct upon peritoneal closure. Anterior repair: The vaginal epithelium overlying the bladder was then infiltrated with Marcaine with epinephrine, sharply dissected off the underlying bladder and connected tissue up to just above the trigone as evidenced by the positioning of the Foley bulb, and a series of 3 horizontal mattress sutures of 0 Vicryl placed in the underside of the vaginal epithelium pulling it to the midline and reducing the cystocele. Redundant vaginal epithelium was trimmed and then the vaginal cuff closed pulling the remaining skin edges posterior and attaching them to the posterior aspects of the vaginal cuff resulting in good upward and posterior orientation of the bladder support .  Posterior repair: The vaginal epithelium was again infiltrated with Marcaine with epinephrine x10 cc, sharply dissected laterally identifying the lateral tissues it can be pulled together in the midline with a series of 3 interrupted sutures of 0 Vicryl rebuilding the perineal  body in a broad fashion resulting in good perineal support. The vaginal epithelium was then pulled together with 2-0 chromic continuous running with introitus diameter considered good with 2 fingerbreadth introitus size. At No time was there any suspicion of injury of the bladder or bowel. Vaginal packing with Betadine soaked gauze completed the procedure and patient allowed to be repositioned and taken to the recovery room in stable condition sponge and needle counts correct

## 2013-01-26 NOTE — Brief Op Note (Signed)
01/26/2013  10:05 AM  PATIENT:  Jill Mcguire  62 y.o. female  PRE-OPERATIVE DIAGNOSIS:  UTERINE FIBROIDS UTEROVAGINAL PROLAPSE,INCOMPLETE, cystocele  POST-OPERATIVE DIAGNOSIS:  UTERINE FIBROIDS UTEROVAGINAL PROLAPSE,INCOMPLETE, cystocele  PROCEDURE:  Procedure(s): HYSTERECTOMY VAGINAL (N/A) ANTERIOR (CYSTOCELE) AND POSTERIOR REPAIR (RECTOCELE) (N/A) OOPHORECTOMY (Left)  SURGEON:  Surgeon(s) and Role:    * Tilda Burrow, MD - Primary  PHYSICIAN ASSISTANT:   ASSISTANTS: Morrie Sheldon, RNFA, Blackwell CST   ANESTHESIA:   local and general  EBL:  Total I/O In: 2400 [I.V.:2400] Out: 300 [Urine:300]  BLOOD ADMINISTERED:none  DRAINS: Urinary Catheter (Foley) also vaginal packing Betadine soaked gauze  LOCAL MEDICATIONS USED:  MARCAINE    and Amount: 25 ml  SPECIMEN:  Source of Specimen:  Uterus, cervix, left tube and ovary  DISPOSITION OF SPECIMEN:  PATHOLOGY  COUNTS:  YES  TOURNIQUET:  * No tourniquets in log *  DICTATION: .Dragon Dictation  PLAN OF CARE: Admit for overnight observation  PATIENT DISPOSITION:  PACU - hemodynamically stable.   Delay start of Pharmacological VTE agent (>24hrs) due to surgical blood loss or risk of bleeding: not applicable

## 2013-01-26 NOTE — Anesthesia Preprocedure Evaluation (Addendum)
Anesthesia Evaluation  Patient identified by MRN, date of birth, ID band Patient awake    Reviewed: Allergy & Precautions, H&P , NPO status , Patient's Chart, lab work & pertinent test results  Airway Mallampati: II TM Distance: >3 FB     Dental  (+) Teeth Intact   Pulmonary neg pulmonary ROS, former smoker,  breath sounds clear to auscultation        Cardiovascular hypertension, Pt. on medications Rhythm:Regular Rate:Normal     Neuro/Psych PSYCHIATRIC DISORDERS Anxiety    GI/Hepatic GERD-  Medicated and Controlled,  Endo/Other    Renal/GU      Musculoskeletal   Abdominal   Peds  Hematology   Anesthesia Other Findings Reports rash from local anesthetic 30 yrs ago attributed to epinephrine, but probably more likely caused by preservatives.  Reproductive/Obstetrics                          Anesthesia Physical Anesthesia Plan  ASA: II  Anesthesia Plan: General   Post-op Pain Management:    Induction: Intravenous  Airway Management Planned: Oral ETT  Additional Equipment:   Intra-op Plan:   Post-operative Plan: Extubation in OR  Informed Consent: I have reviewed the patients History and Physical, chart, labs and discussed the procedure including the risks, benefits and alternatives for the proposed anesthesia with the patient or authorized representative who has indicated his/her understanding and acceptance.     Plan Discussed with:   Anesthesia Plan Comments:         Anesthesia Quick Evaluation

## 2013-01-26 NOTE — Transfer of Care (Signed)
Immediate Anesthesia Transfer of Care Note  Patient: Jill Mcguire  Procedure(s) Performed: Procedure(s): HYSTERECTOMY VAGINAL (N/A) ANTERIOR (CYSTOCELE) AND POSTERIOR REPAIR (RECTOCELE) (N/A) OOPHORECTOMY (Left)  Patient Location: PACU  Anesthesia Type:General  Level of Consciousness: awake  Airway & Oxygen Therapy: Patient Spontanous Breathing  Post-op Assessment: Report given to PACU RN and Post -op Vital signs reviewed and stable  Post vital signs: Reviewed and stable  Complications: No apparent anesthesia complications

## 2013-01-26 NOTE — Anesthesia Postprocedure Evaluation (Signed)
  Anesthesia Post-op Note  Patient: Jill Mcguire  Procedure(s) Performed: Procedure(s): HYSTERECTOMY VAGINAL (N/A) ANTERIOR (CYSTOCELE) AND POSTERIOR REPAIR (RECTOCELE) (N/A) OOPHORECTOMY (Left)  Patient Location: PACU  Anesthesia Type:General  Level of Consciousness: awake, alert  and oriented  Airway and Oxygen Therapy: Patient Spontanous Breathing and Patient connected to face mask oxygen  Post-op Pain: none  Post-op Assessment: Post-op Vital signs reviewed, Patient's Cardiovascular Status Stable, Respiratory Function Stable, Patent Airway and No signs of Nausea or vomiting  Post-op Vital Signs: Reviewed and stable  Complications: No apparent anesthesia complications

## 2013-01-27 ENCOUNTER — Encounter (HOSPITAL_COMMUNITY): Payer: Self-pay | Admitting: Obstetrics and Gynecology

## 2013-01-27 LAB — CBC
HCT: 36 % (ref 36.0–46.0)
Hemoglobin: 12.1 g/dL (ref 12.0–15.0)
MCH: 31.8 pg (ref 26.0–34.0)
MCHC: 33.6 g/dL (ref 30.0–36.0)
MCV: 94.7 fL (ref 78.0–100.0)
Platelets: 257 10*3/uL (ref 150–400)
RBC: 3.8 MIL/uL — ABNORMAL LOW (ref 3.87–5.11)
RDW: 12.5 % (ref 11.5–15.5)
WBC: 10.2 10*3/uL (ref 4.0–10.5)

## 2013-01-27 LAB — BASIC METABOLIC PANEL
BUN: 7 mg/dL (ref 6–23)
CO2: 29 mEq/L (ref 19–32)
Calcium: 9.2 mg/dL (ref 8.4–10.5)
Chloride: 100 mEq/L (ref 96–112)
Creatinine, Ser: 0.75 mg/dL (ref 0.50–1.10)
GFR calc Af Amer: >90 mL/min (ref >90)
GFR calc non Af Amer: 88 mL/min — ABNORMAL LOW (ref >90)
Glucose, Bld: 106 mg/dL — ABNORMAL HIGH (ref 70–99)
Potassium: 3.7 mEq/L (ref 3.5–5.1)
Sodium: 140 mEq/L (ref 135–145)

## 2013-01-27 MED ORDER — TRAMADOL HCL 50 MG PO TABS: 50.0000 mg | ORAL_TABLET | Freq: Four times a day (QID) | ORAL | Status: DC | PRN

## 2013-01-27 MED ORDER — DSS 100 MG PO CAPS: 100.0000 mg | ORAL_CAPSULE | Freq: Two times a day (BID) | ORAL | Status: DC

## 2013-01-27 NOTE — Progress Notes (Signed)
Foley and vaginal packing removed. Pt tolerated well. No active bleeding at this time. Pt safe and stable. Will continue to monitor.

## 2013-01-27 NOTE — Progress Notes (Signed)
Patient ID: Jill Mcguire, female   DOB: 03-02-1951, 62 y.o.   MRN: [id number] Physician Discharge Summary  Patient ID: Jill Mcguire MRN: [id number] DOB/AGE: [birth year] 62 y.o.  Admit date: 01/26/2013 Discharge date: 01/27/2013  Admission Diagnoses: Cystocele second-degree uterine descensus  Discharge Diagnoses: Same Active Problems:   * No active hospital problems. *   Discharged Condition: good  Hospital Course: Patient was admitted through same-day surgery for vaginal hysterectomy anterior and posterior repair with left salpingo-oophorectomy, performed with less than 100 cc blood loss, kept overnight for pain control with vaginal packing and Foley catheter in place. Thank needs were minimal, the patient had discontinuation of the catheter and the following morning, toleration of diet by lunch she was discharged on postop day 1 .  Consults: None  Significant Diagnostic Studies: labs:  CBC    Component Value Date/Time   WBC 10.2 01/27/2013 0537   RBC 3.80* 01/27/2013 0537   HGB 12.1 01/27/2013 0537   HCT 36.0 01/27/2013 0537   PLT 257 01/27/2013 0537   MCV 94.7 01/27/2013 0537   MCH 31.8 01/27/2013 0537   MCHC 33.6 01/27/2013 0537   RDW 12.5 01/27/2013 0537      Treatments: surgery: Vaginal hysterectomy anterior and posterior repair with left salpingo-oophorectomy  Discharge Exam: Blood pressure 116/57, pulse 95, temperature 98.1 F (36.7 C), temperature source Oral, resp. rate 16, height 5\' 7"  (1.702 m), weight 184 lb (83.462 kg), SpO2 99.00%. Resp: clear to auscultation bilaterally GI: soft, non-tender; bowel sounds normal; no masses,  no organomegaly Extremities: extremities normal, atraumatic, no cyanosis or edema  Disposition:    Future Appointments Provider Department Dept Phone   02/03/2013 11:30 AM Tilda Burrow, MD [facility] [phone]       Medication List    ASK your doctor about these medications       amLODipine 10 MG tablet   Commonly known as:  NORVASC  Take 10 mg by mouth daily.     buPROPion 300 MG 24 hr tablet  Commonly known as:  WELLBUTRIN XL  Take 300 mg by mouth daily.     Co Q 10 100 MG Caps  Take 1 capsule by mouth daily.     fish oil-omega-3 fatty acids 1000 MG capsule  Take 1 g by mouth daily.     Gellhorn Pessary Misc  1 Device by Does not apply route continuous.     pravastatin 20 MG tablet  Commonly known as:  PRAVACHOL  Take 20 mg by mouth daily.     vitamin B-12 1000 MCG tablet  Commonly known as:  CYANOCOBALAMIN  Take 1,000 mcg by mouth daily.       also Vicodin 5 500x20 tablets to use when necessary pain Also Colace 100 mg twice a day x30 days Also Motrin when necessary pain   Signed: Jamir Rone V 01/27/2013, 1:38 PM

## 2013-01-27 NOTE — Progress Notes (Signed)
Utilization review completed.  

## 2013-01-27 NOTE — Progress Notes (Signed)
Patient discharged home. IV removed. Assessment unchanged from this morning. Patient in no acute distress. Patient to be accompanied downstairs by staff and family.   Lexie Velencia Lenart RN 

## 2013-01-27 NOTE — Anesthesia Postprocedure Evaluation (Signed)
  Anesthesia Post-op Note  Patient: Jill Mcguire  Procedure(s) Performed: Procedure(s): HYSTERECTOMY VAGINAL (N/A) ANTERIOR (CYSTOCELE) AND POSTERIOR REPAIR (RECTOCELE) (N/A) OOPHORECTOMY (Left)  Patient Location: Room 315  Anesthesia Type:General  Level of Consciousness: awake, alert , oriented and patient cooperative  Airway and Oxygen Therapy: Patient Spontanous Breathing and Patient connected to nasal cannula oxygen  Post-op Pain: mild  Post-op Assessment: Post-op Vital signs reviewed, Patient's Cardiovascular Status Stable, Respiratory Function Stable, Patent Airway, NAUSEA AND VOMITING PRESENT and Pain level controlled  Post-op Vital Signs: Reviewed and stable  Complications: No apparent anesthesia complications

## 2013-01-28 ENCOUNTER — Other Ambulatory Visit: Payer: Self-pay

## 2013-02-01 NOTE — Discharge Summary (Signed)
See COMPLETE DISCHARGE SUMMARY COMPLETED IN "PROGRESS NOTE' of 01/27/13.

## 2013-02-03 ENCOUNTER — Encounter: Payer: Self-pay | Admitting: Obstetrics and Gynecology

## 2013-02-03 ENCOUNTER — Ambulatory Visit (INDEPENDENT_AMBULATORY_CARE_PROVIDER_SITE_OTHER): Payer: Self-pay | Admitting: Obstetrics and Gynecology

## 2013-02-03 VITALS — BP 130/68 | Ht 67.0 in | Wt 181.0 lb

## 2013-02-03 DIAGNOSIS — Z9889 Other specified postprocedural states: Secondary | ICD-10-CM

## 2013-02-03 NOTE — Progress Notes (Signed)
Patient ID: Jill Mcguire, female   DOB: 1950-10-20, 62 y.o.   MRN: [id number] Pt here today for post op from Hysterectomy and anterior/ posterior repair. Pt denies any problems or concerns at this time. Pt stated that "she has never had a surgery and not had any pain".   Assessment:  Post-Op status post vaginal hysterectomy, anterior colporrhaphy and posterior colporrhaphy  And left salpingoophorectomy for .2 weeks ago.  Doing well postoperatively.   Plan:  1. Continue any current medications. 2. Wound care discussed. 3. Activity restrictions: no lifting more than 15 pounds 4. Anticipated return to work: 4 weeks. 5. Follow up in 4 week.  Subjective:    She has been eating a regular diet without difficulty.  Bowel movements are normal. The patient is not having any pain.  Review of Systems Negative except as per HPI   Objective:  BP 130/68  Ht 5\' 7"  (1.702 m)  Wt 181 lb (82.101 kg)  BMI 28.34 kg/m2 General:Well developed, well nourished.  No acute distress. Abdomen: Bowel sounds normal, soft, non-tender. Pelvic Exam: External Genitalia:  Normal. Vagina: Normal Bimanual: Normal Cervix: absent Adnexa: Normal Incision(s):Healing well, no drainage, no erythema, no hernia, no swelling, no dehiscence, incision well approximated.  Consider amitiza if constipation persists

## 2013-02-09 ENCOUNTER — Telehealth: Payer: Self-pay | Admitting: Obstetrics and Gynecology

## 2013-02-09 NOTE — Telephone Encounter (Signed)
Rash started Saturday evening and a cough yesterday. Pt advised to call PCP, Dr. Emelda Fear not in the office today. Pt verbalized understanding.

## 2013-03-03 ENCOUNTER — Encounter: Payer: BC Managed Care – PPO | Admitting: Obstetrics and Gynecology

## 2013-03-03 ENCOUNTER — Encounter: Payer: Self-pay | Admitting: Obstetrics and Gynecology

## 2013-03-03 ENCOUNTER — Ambulatory Visit (INDEPENDENT_AMBULATORY_CARE_PROVIDER_SITE_OTHER): Payer: Self-pay | Admitting: Obstetrics and Gynecology

## 2013-03-03 VITALS — BP 120/76 | Ht 67.0 in | Wt 182.0 lb

## 2013-03-03 DIAGNOSIS — Z9889 Other specified postprocedural states: Secondary | ICD-10-CM

## 2013-03-03 DIAGNOSIS — Z90710 Acquired absence of both cervix and uterus: Secondary | ICD-10-CM

## 2013-03-03 NOTE — Progress Notes (Signed)
Patient ID: Jill Mcguire, female   DOB: 1950-09-18, 62 y.o.   MRN: [id number] Pt denies any problems or concerns at this time.   Subjective:  Jill Mcguire is a 62 y.o. female who presents to the clinic 6 weeks status post vaginal hysterectomy, anterior colporrhaphy and posterior colporrhaphy.  She has been eating a regular diet without difficulty.  Bowel movements are normal but require stool softener. The patient is not having any pain.  Review of Systems Negative except as per HPI   Objective:  BP 120/76  Ht 5\' 7"  (1.702 m)  Wt 182 lb (82.555 kg)  BMI 28.50 kg/m2 General:Well developed, well nourished.  No acute distress. Abdomen: Bowel sounds normal, soft, non-tender. Pelvic Exam: External Genitalia:  Normal. Vagina: Normal Bimanual: Normal Cervix: absent, good cuff support Uterus: absent Adnexa: nontender Incision(s):Healing well, no drainage, no erythema, no hernia, no swelling, no dehiscence, incision well approximated.   Assessment:  Post-Op s/p vaginal hysterectomy, anterior colporrhaphy and posterior colporrhaphy 6 weeks ago.    Release to full duty Doing well postoperatively.   Plan:  1. Continue any current medications. 2. Wound care discussed. 3. Activity restrictions: none 4. 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 5. Follow up in 1 year.

## 2013-03-03 NOTE — Patient Instructions (Signed)
Full release

## 2013-06-24 ENCOUNTER — Encounter: Payer: Self-pay | Admitting: Obstetrics and Gynecology

## 2013-12-31 DIAGNOSIS — Z6829 Body mass index (BMI) 29.0-29.9, adult: Secondary | ICD-10-CM | POA: Diagnosis not present

## 2013-12-31 DIAGNOSIS — M2010 Hallux valgus (acquired), unspecified foot: Secondary | ICD-10-CM | POA: Diagnosis not present

## 2013-12-31 DIAGNOSIS — Z23 Encounter for immunization: Secondary | ICD-10-CM | POA: Diagnosis not present

## 2013-12-31 DIAGNOSIS — I10 Essential (primary) hypertension: Secondary | ICD-10-CM | POA: Diagnosis not present

## 2013-12-31 DIAGNOSIS — R7301 Impaired fasting glucose: Secondary | ICD-10-CM | POA: Diagnosis not present

## 2014-03-10 DIAGNOSIS — M25559 Pain in unspecified hip: Secondary | ICD-10-CM | POA: Diagnosis not present

## 2014-03-10 DIAGNOSIS — M2010 Hallux valgus (acquired), unspecified foot: Secondary | ICD-10-CM | POA: Diagnosis not present

## 2014-03-10 DIAGNOSIS — Z6829 Body mass index (BMI) 29.0-29.9, adult: Secondary | ICD-10-CM | POA: Diagnosis not present

## 2014-03-10 DIAGNOSIS — M169 Osteoarthritis of hip, unspecified: Secondary | ICD-10-CM | POA: Diagnosis not present

## 2014-03-10 DIAGNOSIS — I10 Essential (primary) hypertension: Secondary | ICD-10-CM | POA: Diagnosis not present

## 2014-03-10 DIAGNOSIS — R7301 Impaired fasting glucose: Secondary | ICD-10-CM | POA: Diagnosis not present

## 2014-03-10 DIAGNOSIS — E785 Hyperlipidemia, unspecified: Secondary | ICD-10-CM | POA: Diagnosis not present

## 2014-03-24 DIAGNOSIS — M7062 Trochanteric bursitis, left hip: Secondary | ICD-10-CM | POA: Diagnosis not present

## 2014-03-24 DIAGNOSIS — M1612 Unilateral primary osteoarthritis, left hip: Secondary | ICD-10-CM | POA: Diagnosis not present

## 2014-04-12 DIAGNOSIS — M25559 Pain in unspecified hip: Secondary | ICD-10-CM | POA: Diagnosis not present

## 2014-04-12 DIAGNOSIS — F17200 Nicotine dependence, unspecified, uncomplicated: Secondary | ICD-10-CM | POA: Diagnosis not present

## 2014-04-12 DIAGNOSIS — I10 Essential (primary) hypertension: Secondary | ICD-10-CM | POA: Diagnosis not present

## 2014-04-12 DIAGNOSIS — S39012A Strain of muscle, fascia and tendon of lower back, initial encounter: Secondary | ICD-10-CM | POA: Diagnosis not present

## 2014-04-12 DIAGNOSIS — Z6829 Body mass index (BMI) 29.0-29.9, adult: Secondary | ICD-10-CM | POA: Diagnosis not present

## 2014-04-20 DIAGNOSIS — H25812 Combined forms of age-related cataract, left eye: Secondary | ICD-10-CM | POA: Diagnosis not present

## 2014-05-03 DIAGNOSIS — H25813 Combined forms of age-related cataract, bilateral: Secondary | ICD-10-CM | POA: Diagnosis not present

## 2014-05-05 ENCOUNTER — Ambulatory Visit: Payer: Self-pay | Admitting: Orthopedic Surgery

## 2014-05-05 NOTE — Progress Notes (Signed)
Please put orders in Epic surgery 05-18-14 pre op 05-12-14 Thanks

## 2014-05-05 NOTE — H&P (Signed)
Jill Mcguire is an 64 y.o. female.   Chief Complaint: back and leg pain HPI: The patient is a 64 year old female who presents with back pain. The patient reports low back symptoms which began 11 day(s) ago in association with an established activity. The activity involved that occurred at home while doing prone hip extension exercises (seen by Richardson Landry 03/24/14 and was given hip stretches and strengthening exercises). Symptoms are reported to be located in the left low back and Symptoms include pain and burning. The pain radiates to the left buttock, left thigh, left posterior thigh and right lateral thigh. Symptoms are exacerbated by standing and direct pressure. Symptoms are relieved by rest, while symptoms are not relieved by opioid analgesics. Current treatment includes opioid analgesics, muscle relaxants and heating pad. Pertinent medical history includes previous back surgery (decompression right L5-S1 August 2008 by Dr. Tonita Cong). This problem has not been previously evaluated.   Past Medical History  Diagnosis Date  . Breast CA 2000    right breast  . Anxiety   . GERD (gastroesophageal reflux disease)   . Hypertension   . Hyperlipidemia     Past Surgical History  Procedure Laterality Date  . Breast lumpectomy  2001    lymph node dissection  . Appendectomy  1982  . Salpingoophorectomy  1982    right  . Anterior cervical laminectomy  1996  . Right eye muscel dissection  1995  . Lumbar disc surgery  2008    L4-L5  . Vaginal hysterectomy N/A 01/26/2013    Procedure: HYSTERECTOMY VAGINAL;  Surgeon: Jonnie Kind, MD;  Location: [facility];  Service: Gynecology;  Laterality: N/A;  . Anterior and posterior repair N/A 01/26/2013    Procedure: ANTERIOR (CYSTOCELE) AND POSTERIOR REPAIR (RECTOCELE);  Surgeon: Jonnie Kind, MD;  Location: [facility];  Service: Gynecology;  Laterality: N/A;  . Oophorectomy Left 01/26/2013    Procedure: OOPHORECTOMY;  Surgeon: Jonnie Kind, MD;  Location: [facility];  Service: Gynecology;  Laterality: Left;    Family History  Problem Relation Age of Onset  . Diabetes Mother   . Hypertension Mother   . Stroke Mother   . Cancer - Lung Father   . Cancer - Lung Brother   . Cancer - Lung Brother   . Heart attack Brother   . Heart attack Brother    Social History:  reports that she quit smoking about 5 years ago. She has never used smokeless tobacco. She reports that she drinks alcohol. She reports that she does not use illicit drugs.  Allergies:  Allergies  Allergen Reactions  . Darvocet [Propoxyphene N-Acetaminophen] Rash  . Epinephrine     Rapid heart rate, decreased BP after local anesthetic injection to skin 30 yrs ago Tolerated Marcaine&Epi completely 01/26/13 for Ant & Post Vag repair.  . Tagamet [Cimetidine] Rash  . Tetanus Toxoids Rash     (Not in a hospital admission)  No results found for this or any previous visit (from the past 48 hour(s)). No results found.  Review of Systems  Constitutional: Negative.   HENT: Negative.   Eyes: Negative.   Respiratory: Negative.   Cardiovascular: Negative.   Gastrointestinal: Negative.   Genitourinary: Negative.   Musculoskeletal: Positive for back pain.  Skin: Negative.   Neurological: Positive for focal weakness.  Psychiatric/Behavioral: Negative.     There were no vitals taken for this visit. Physical Exam  Constitutional: She is oriented to person, place, and time. She appears well-developed and  well-nourished.  HENT:  Head: Normocephalic and atraumatic.  Eyes: Conjunctivae and EOM are normal. Pupils are equal, round, and reactive to light.  Neck: Normal range of motion. Neck supple.  Cardiovascular: Normal rate and regular rhythm.   Respiratory: Effort normal and breath sounds normal.  GI: Soft. Bowel sounds are normal.  Musculoskeletal:  She is upright. Minimal distress. Straight leg raise on the left produces low back and buttock pain. On the right it is negative.  Trace EHL weakness. Limited extension and flexion of the lumbar spine.  Lumbar spine exam reveals no evidence of soft tissue swelling, ecchymosis or deformity. The abdomen is soft and nontender. Nontender over the trochanters. No cellulitis or lymphadenopathy. Good range of motion of the lumbar spine without associated pain. Straight leg raise is negative. Motor is 5/5 including EHL, tibialis anterior, plantar flexion, quadriceps and hamstrings. Patient is normoreflexic. There is no Babinski or clonus. Sensory exam is intact to light touch. The patient has good distal pulses. No DVT. No pain and normal range of motion without instability of the hips, knees and ankles.  Neurological: She is alert and oriented to person, place, and time. She has normal reflexes.  Skin: Skin is warm and dry.  Psychiatric: She has a normal mood and affect.    X-rays demonstrate disk degeneration of L5-S1, neural foraminal stenosis, mild osteoarthrosis of the hips.  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 Compression of the thecal sac and generating severe spinal stenosis. I called her at home. She is having severe pain in her buttock and leg. I indicated to her she would wise to consider lumbar decompression and she would like to proceed with that. I filled out a surgical flow sheet for Judeen Hammans to contact her. [name] will see me on Thursday for further consultation. This is to expidite the situation given the compression of the thecal sac. She has had no problems with her bowel or bladder function at this point though. If she does I have instructed her to call. We discussed signs and symptoms of that.  Plan microlumbar decompression L4-5  Maty Zeisler M. PA-C for Dr. Tonita Cong 05/05/2014, 1:27 PM

## 2014-05-10 NOTE — Patient Instructions (Signed)
Your procedure is scheduled on:  05/18/14  [facility]  Report to [facility] at  [place]     AM.   Call this number if you have problems the morning of surgery: [phone]        Do not eat food  Or drink :After Midnight. Tuesday NIGHT   Take these medicines the morning of surgery with A SIP OF WATER: AMLODIPINE, WELLBUTRIN   .  Contacts, dentures or partial plates, or metal hairpins  can not be worn to surgery. Your family will be responsible for glasses, dentures, hearing aides while you are in surgery  Leave suitcase in the car. After surgery it may be brought to your room.  For patients admitted to the hospital, checkout time is 11:00 AM day of  discharge.         [facility] IS NOT RESPONSIBLE FOR ANY VALUABLES  Patients discharged the day of surgery will not be allowed to drive home. IF going home the day of surgery, you must have a driver and someone to stay with you for the first 24 hours                                                                  [facility] - Preparing for Surgery Before surgery, you can play an important role.  Because skin is not sterile, your skin needs to be as free of germs as possible.  You can reduce the number of germs on your skin by washing with CHG (chlorahexidine gluconate) soap before surgery.  CHG is an antiseptic cleaner which kills germs and bonds with the skin to continue killing germs even after washing. Please DO NOT use if you have an allergy to CHG or antibacterial soaps.  If your skin becomes reddened/irritated stop using the CHG and inform your nurse when you arrive at Short Stay. Do not shave (including legs and underarms) for at least 48 hours prior to the first CHG shower.  You may shave your face/neck. Please follow these instructions carefully:  1.  Shower with CHG Soap the night before surgery and the  morning of Surgery.  2.  If you choose to wash your  hair, wash your hair first as usual with your  normal  shampoo.  3.  After you shampoo, rinse your hair and body thoroughly to remove the  shampoo.                           4.  Use CHG as you would any other liquid soap.  You can apply chg directly  to the skin and wash                       Gently with a scrungie or clean washcloth.  5.  Apply the CHG Soap to your body ONLY FROM THE NECK DOWN.   Do not use on face/ open                           Wound or open sores. Avoid contact with eyes, ears mouth and genitals (  private parts).                       Wash face,  Genitals (private parts) with your normal soap.             6.  Wash thoroughly, paying special attention to the area where your surgery  will be performed.  7.  Thoroughly rinse your body with warm water from the neck down.  8.  DO NOT shower/wash with your normal soap after using and rinsing off  the CHG Soap.                9.  Pat yourself dry with a clean towel.            10.  Wear clean pajamas.            11.  Place clean sheets on your bed the night of your first shower and do not  sleep with pets. Day of Surgery : Do not apply any lotions/deodorants the morning of surgery.  Please wear clean clothes to the hospital/surgery center.  FAILURE TO FOLLOW THESE INSTRUCTIONS MAY RESULT IN THE CANCELLATION OF YOUR SURGERY PATIENT SIGNATURE_________________________________  NURSE SIGNATURE__________________________________  ________________________________________________________________________   Jill Mcguire  An incentive spirometer is a tool that can help keep your lungs clear and active. This tool measures how well you are filling your lungs with each breath. Taking long deep breaths may help reverse or decrease the chance of developing breathing (pulmonary) problems (especially infection) following:  A long period of time when you are unable to move or be active. BEFORE THE PROCEDURE   If the spirometer  includes an indicator to show your best effort, your nurse or respiratory therapist will set it to a desired goal.  If possible, sit up straight or lean slightly forward. Try not to slouch.  Hold the incentive spirometer in an upright position. INSTRUCTIONS FOR USE  1. Sit on the edge of your bed if possible, or sit up as far as you can in bed or on a chair. 2. Hold the incentive spirometer in an upright position. 3. Breathe out normally. 4. Place the mouthpiece in your mouth and seal your lips tightly around it. 5. Breathe in slowly and as deeply as possible, raising the piston or the ball toward the top of the column. 6. Hold your breath for 3-5 seconds or for as long as possible. Allow the piston or ball to fall to the bottom of the column. 7. Remove the mouthpiece from your mouth and breathe out normally. 8. Rest for a few seconds and repeat Steps 1 through 7 at least 10 times every 1-2 hours when you are awake. Take your time and take a few normal breaths between deep breaths. 9. The spirometer may include an indicator to show your best effort. Use the indicator as a goal to work toward during each repetition. 10. After each set of 10 deep breaths, practice coughing to be sure your lungs are clear. If you have an incision (the cut made at the time of surgery), support your incision when coughing by placing a pillow or rolled up towels firmly against it. Once you are able to get out of bed, walk around indoors and cough well. You may stop using the incentive spirometer when instructed by your caregiver.  RISKS AND COMPLICATIONS  Take your time so you do not get dizzy or light-headed.  If you are in pain, you may need  to take or ask for pain medication before doing incentive spirometry. It is harder to take a deep breath if you are having pain. AFTER USE  Rest and breathe slowly and easily.  It can be helpful to keep track of a log of your progress. Your caregiver can provide you with a  simple table to help with this. If you are using the spirometer at home, follow these instructions: [place] IF:   You are having difficultly using the spirometer.  You have trouble using the spirometer as often as instructed.  Your pain medication is not giving enough relief while using the spirometer.  You develop fever of 100.5 F (38.1 C) or higher. SEEK IMMEDIATE MEDICAL CARE IF:   You cough up bloody sputum that had not been present before.  You develop fever of 102 F (38.9 C) or greater.  You develop worsening pain at or near the incision site. MAKE SURE YOU:   Understand these instructions.  Will watch your condition.  Will get help right away if you are not doing well or get worse. Document Released: 07/22/2006 Document Revised: 06/03/2011 Document Reviewed: 09/22/2006 [facility] Patient Information 2014 [place], [place].   ________________________________________________________________________

## 2014-05-11 ENCOUNTER — Ambulatory Visit: Payer: Self-pay | Admitting: Orthopedic Surgery

## 2014-05-12 ENCOUNTER — Ambulatory Visit (HOSPITAL_COMMUNITY)
Admission: RE | Admit: 2014-05-12 | Discharge: 2014-05-12 | Disposition: A | Discharge status: Home / Self Care | Payer: Medicare Other | Source: Ambulatory Visit | Attending: Orthopedic Surgery | Admitting: Orthopedic Surgery

## 2014-05-12 ENCOUNTER — Ambulatory Visit (HOSPITAL_COMMUNITY)
Admission: RE | Admit: 2014-05-12 | Discharge: 2014-05-12 | Disposition: A | Discharge status: Home / Self Care | Payer: Medicare Other | Source: Ambulatory Visit | Attending: Anesthesiology | Admitting: Anesthesiology

## 2014-05-12 ENCOUNTER — Other Ambulatory Visit: Payer: Self-pay

## 2014-05-12 ENCOUNTER — Encounter (HOSPITAL_COMMUNITY)
Admission: RE | Admit: 2014-05-12 | Discharge: 2014-05-12 | Disposition: A | Discharge status: Home / Self Care | Payer: Medicare Other | Source: Ambulatory Visit | Attending: Specialist | Admitting: Specialist

## 2014-05-12 ENCOUNTER — Encounter (HOSPITAL_COMMUNITY): Payer: Self-pay

## 2014-05-12 DIAGNOSIS — M5126 Other intervertebral disc displacement, lumbar region: Secondary | ICD-10-CM | POA: Insufficient documentation

## 2014-05-12 DIAGNOSIS — R05 Cough: Secondary | ICD-10-CM

## 2014-05-12 DIAGNOSIS — Z01812 Encounter for preprocedural laboratory examination: Secondary | ICD-10-CM | POA: Insufficient documentation

## 2014-05-12 DIAGNOSIS — Z01818 Encounter for other preprocedural examination: Secondary | ICD-10-CM | POA: Diagnosis not present

## 2014-05-12 DIAGNOSIS — R058 Other specified cough: Secondary | ICD-10-CM

## 2014-05-12 DIAGNOSIS — M5137 Other intervertebral disc degeneration, lumbosacral region: Secondary | ICD-10-CM | POA: Diagnosis not present

## 2014-05-12 HISTORY — DX: Other specified postprocedural states: R11.2

## 2014-05-12 HISTORY — DX: Cough: R05

## 2014-05-12 HISTORY — DX: Other specified postprocedural states: Z98.890

## 2014-05-12 LAB — BASIC METABOLIC PANEL
Anion gap: 7 (ref 5–15)
BUN: 15 mg/dL (ref 6–23)
CO2: 32 mmol/L (ref 19–32)
Calcium: 9.4 mg/dL (ref 8.4–10.5)
Chloride: 100 mmol/L (ref 96–112)
Creatinine, Ser: 0.93 mg/dL (ref 0.50–1.10)
GFR calc Af Amer: 73 mL/min — ABNORMAL LOW (ref >90)
GFR calc non Af Amer: 63 mL/min — ABNORMAL LOW (ref >90)
Glucose, Bld: 98 mg/dL (ref 70–99)
Potassium: 3.7 mmol/L (ref 3.5–5.1)
Sodium: 139 mmol/L (ref 135–145)

## 2014-05-12 LAB — CBC
HCT: 41.5 % (ref 36.0–46.0)
Hemoglobin: 13.3 g/dL (ref 12.0–15.0)
MCH: 31.1 pg (ref 26.0–34.0)
MCHC: 32 g/dL (ref 30.0–36.0)
MCV: 97.2 fL (ref 78.0–100.0)
Platelets: 268 10*3/uL (ref 150–400)
RBC: 4.27 MIL/uL (ref 3.87–5.11)
RDW: 12.9 % (ref 11.5–15.5)
WBC: 4.7 10*3/uL (ref 4.0–10.5)

## 2014-05-12 LAB — SURGICAL PCR SCREEN
MRSA, PCR: NEGATIVE
Staphylococcus aureus: POSITIVE — AB

## 2014-05-18 ENCOUNTER — Encounter (HOSPITAL_COMMUNITY): Payer: Self-pay | Admitting: *Deleted

## 2014-05-18 ENCOUNTER — Ambulatory Visit (HOSPITAL_COMMUNITY): Payer: Medicare Other

## 2014-05-18 ENCOUNTER — Ambulatory Visit (HOSPITAL_COMMUNITY)
Admission: RE | Admit: 2014-05-18 | Discharge: 2014-05-19 | Disposition: A | Discharge status: Home / Self Care | Payer: Medicare Other | Source: Ambulatory Visit | Attending: Specialist | Admitting: Specialist

## 2014-05-18 ENCOUNTER — Ambulatory Visit (HOSPITAL_COMMUNITY): Payer: Medicare Other | Admitting: Certified Registered Nurse Anesthetist

## 2014-05-18 ENCOUNTER — Encounter (HOSPITAL_COMMUNITY)
Admission: RE | Disposition: A | Discharge status: Home / Self Care | Payer: Self-pay | Source: Ambulatory Visit | Attending: Specialist

## 2014-05-18 DIAGNOSIS — M4806 Spinal stenosis, lumbar region: Secondary | ICD-10-CM | POA: Insufficient documentation

## 2014-05-18 DIAGNOSIS — K219 Gastro-esophageal reflux disease without esophagitis: Secondary | ICD-10-CM | POA: Insufficient documentation

## 2014-05-18 DIAGNOSIS — M5126 Other intervertebral disc displacement, lumbar region: Secondary | ICD-10-CM | POA: Insufficient documentation

## 2014-05-18 DIAGNOSIS — I10 Essential (primary) hypertension: Secondary | ICD-10-CM | POA: Insufficient documentation

## 2014-05-18 DIAGNOSIS — Z419 Encounter for procedure for purposes other than remedying health state, unspecified: Secondary | ICD-10-CM

## 2014-05-18 DIAGNOSIS — E785 Hyperlipidemia, unspecified: Secondary | ICD-10-CM | POA: Insufficient documentation

## 2014-05-18 DIAGNOSIS — Z87891 Personal history of nicotine dependence: Secondary | ICD-10-CM | POA: Insufficient documentation

## 2014-05-18 DIAGNOSIS — M48061 Spinal stenosis, lumbar region without neurogenic claudication: Secondary | ICD-10-CM | POA: Diagnosis present

## 2014-05-18 HISTORY — PX: LUMBAR LAMINECTOMY/DECOMPRESSION MICRODISCECTOMY: SHX5026

## 2014-05-18 SURGERY — LUMBAR LAMINECTOMY/DECOMPRESSION MICRODISCECTOMY 1 LEVEL
Anesthesia: General | Site: Back

## 2014-05-18 MED ORDER — MIDAZOLAM HCL 2 MG/2ML IJ SOLN
INTRAMUSCULAR | Status: AC
  Filled 2014-05-18: qty 2

## 2014-05-18 MED ORDER — THROMBIN 5000 UNITS EX SOLR
CUTANEOUS | Status: AC
  Filled 2014-05-18: qty 10000

## 2014-05-18 MED ORDER — DEXAMETHASONE SODIUM PHOSPHATE 10 MG/ML IJ SOLN
INTRAMUSCULAR | Status: DC | PRN
  Administered 2014-05-18: 10 mg via INTRAVENOUS

## 2014-05-18 MED ORDER — DOCUSATE SODIUM 100 MG PO CAPS
100.0000 mg | ORAL_CAPSULE | Freq: Two times a day (BID) | ORAL | Status: DC
  Administered 2014-05-18 – 2014-05-19 (×2): 100 mg via ORAL

## 2014-05-18 MED ORDER — LIDOCAINE HCL (CARDIAC) 20 MG/ML IV SOLN
INTRAVENOUS | Status: AC
  Filled 2014-05-18: qty 5

## 2014-05-18 MED ORDER — PHENYLEPHRINE HCL 10 MG/ML IJ SOLN
INTRAMUSCULAR | Status: DC | PRN
  Administered 2014-05-18 (×2): 80 ug via INTRAVENOUS
  Administered 2014-05-18 (×2): 40 ug via INTRAVENOUS
  Administered 2014-05-18: 80 ug via INTRAVENOUS

## 2014-05-18 MED ORDER — SODIUM CHLORIDE 0.9 % IJ SOLN: 3.0000 mL | INTRAMUSCULAR | Status: DC | PRN

## 2014-05-18 MED ORDER — METOCLOPRAMIDE HCL 10 MG PO TABS: 5.0000 mg | ORAL_TABLET | Freq: Four times a day (QID) | ORAL | Status: DC | PRN

## 2014-05-18 MED ORDER — LACTATED RINGERS IV SOLN
INTRAVENOUS | Status: DC
  Administered 2014-05-18 (×2): via INTRAVENOUS

## 2014-05-18 MED ORDER — MEPERIDINE HCL 50 MG/ML IJ SOLN: 6.2500 mg | INTRAMUSCULAR | Status: DC | PRN

## 2014-05-18 MED ORDER — HYDROMORPHONE HCL 1 MG/ML IJ SOLN
INTRAMUSCULAR | Status: AC
Start: 2014-05-18 — End: 2014-05-18
  Filled 2014-05-18: qty 1

## 2014-05-18 MED ORDER — PROMETHAZINE HCL 25 MG/ML IJ SOLN
12.5000 mg | Freq: Four times a day (QID) | INTRAMUSCULAR | Status: DC | PRN
  Administered 2014-05-18: 12.5 mg via INTRAVENOUS
  Filled 2014-05-18: qty 1

## 2014-05-18 MED ORDER — BUPROPION HCL ER (XL) 300 MG PO TB24
300.0000 mg | ORAL_TABLET | Freq: Every morning | ORAL | Status: DC
  Administered 2014-05-19: 300 mg via ORAL
  Filled 2014-05-18: qty 1

## 2014-05-18 MED ORDER — ACETAMINOPHEN 325 MG PO TABS
650.0000 mg | ORAL_TABLET | ORAL | Status: DC | PRN
  Administered 2014-05-18: 650 mg via ORAL
  Filled 2014-05-18: qty 2

## 2014-05-18 MED ORDER — MENTHOL 3 MG MT LOZG
1.0000 | LOZENGE | OROMUCOSAL | Status: DC | PRN
  Filled 2014-05-18: qty 9

## 2014-05-18 MED ORDER — ATROPINE SULFATE 0.4 MG/ML IJ SOLN
INTRAMUSCULAR | Status: AC
  Filled 2014-05-18: qty 1

## 2014-05-18 MED ORDER — OXYCODONE-ACETAMINOPHEN 5-325 MG PO TABS: 1.0000 | ORAL_TABLET | ORAL | Status: DC | PRN

## 2014-05-18 MED ORDER — MIDAZOLAM HCL 2 MG/2ML IJ SOLN: 0.5000 mg | Freq: Once | INTRAMUSCULAR | Status: DC | PRN

## 2014-05-18 MED ORDER — KCL IN DEXTROSE-NACL 20-5-0.45 MEQ/L-%-% IV SOLN
INTRAVENOUS | Status: AC
Start: 2014-05-18 — End: 2014-05-18
  Administered 2014-05-18: 11:00:00
  Filled 2014-05-18: qty 1000

## 2014-05-18 MED ORDER — DOCUSATE SODIUM 100 MG PO CAPS: 100.0000 mg | ORAL_CAPSULE | Freq: Two times a day (BID) | ORAL | Status: AC | PRN

## 2014-05-18 MED ORDER — ONDANSETRON HCL 4 MG/2ML IJ SOLN
4.0000 mg | INTRAMUSCULAR | Status: DC | PRN
  Administered 2014-05-18: 4 mg via INTRAVENOUS
  Filled 2014-05-18: qty 2

## 2014-05-18 MED ORDER — LIDOCAINE HCL (CARDIAC) 20 MG/ML IV SOLN
INTRAVENOUS | Status: DC | PRN
Start: 2014-05-18 — End: 2014-05-18
  Administered 2014-05-18: 80 mg via INTRAVENOUS

## 2014-05-18 MED ORDER — HYDROMORPHONE HCL 1 MG/ML IJ SOLN
INTRAMUSCULAR | Status: AC
  Filled 2014-05-18: qty 1

## 2014-05-18 MED ORDER — THROMBIN 5000 UNITS EX SOLR
OROMUCOSAL | Status: DC | PRN
  Administered 2014-05-18: 10000 mL via TOPICAL

## 2014-05-18 MED ORDER — AMLODIPINE BESYLATE 10 MG PO TABS
10.0000 mg | ORAL_TABLET | Freq: Every morning | ORAL | Status: DC
  Administered 2014-05-19: 10 mg via ORAL
  Filled 2014-05-18: qty 1

## 2014-05-18 MED ORDER — NEOSTIGMINE METHYLSULFATE 10 MG/10ML IV SOLN
INTRAVENOUS | Status: AC
  Filled 2014-05-18: qty 1

## 2014-05-18 MED ORDER — RISAQUAD PO CAPS
1.0000 | ORAL_CAPSULE | Freq: Every day | ORAL | Status: DC
  Administered 2014-05-18 – 2014-05-19 (×2): 1 via ORAL
  Filled 2014-05-18 (×2): qty 1

## 2014-05-18 MED ORDER — ROCURONIUM BROMIDE 100 MG/10ML IV SOLN
INTRAVENOUS | Status: AC
Start: 2014-05-18 — End: 2014-05-18
  Filled 2014-05-18: qty 1

## 2014-05-18 MED ORDER — ARTIFICIAL TEARS OP OINT
TOPICAL_OINTMENT | Freq: Once | OPHTHALMIC | Status: AC
  Administered 2014-05-18: 1 via OPHTHALMIC
  Filled 2014-05-18: qty 3.5

## 2014-05-18 MED ORDER — ROCURONIUM BROMIDE 100 MG/10ML IV SOLN
INTRAVENOUS | Status: DC | PRN
  Administered 2014-05-18: 30 mg via INTRAVENOUS

## 2014-05-18 MED ORDER — PHENOL 1.4 % MT LIQD
1.0000 | OROMUCOSAL | Status: DC | PRN
  Filled 2014-05-18: qty 177

## 2014-05-18 MED ORDER — SODIUM CHLORIDE 0.9 % IR SOLN
Status: AC
  Filled 2014-05-18: qty 1

## 2014-05-18 MED ORDER — PROPOFOL 10 MG/ML IV BOLUS
INTRAVENOUS | Status: DC | PRN
  Administered 2014-05-18: 150 mg via INTRAVENOUS

## 2014-05-18 MED ORDER — KCL IN DEXTROSE-NACL 20-5-0.45 MEQ/L-%-% IV SOLN
INTRAVENOUS | Status: AC
  Administered 2014-05-18 – 2014-05-19 (×2): via INTRAVENOUS
  Filled 2014-05-18 (×2): qty 1000

## 2014-05-18 MED ORDER — CEFAZOLIN SODIUM-DEXTROSE 2-3 GM-% IV SOLR
INTRAVENOUS | Status: AC
  Filled 2014-05-18: qty 50

## 2014-05-18 MED ORDER — SCOPOLAMINE 1 MG/3DAYS TD PT72
MEDICATED_PATCH | TRANSDERMAL | Status: DC | PRN
  Administered 2014-05-18: 1 via TRANSDERMAL

## 2014-05-18 MED ORDER — BUPIVACAINE-EPINEPHRINE (PF) 0.5% -1:200000 IJ SOLN
INTRAMUSCULAR | Status: AC
  Filled 2014-05-18: qty 30

## 2014-05-18 MED ORDER — METHOCARBAMOL 1000 MG/10ML IJ SOLN
500.0000 mg | Freq: Four times a day (QID) | INTRAVENOUS | Status: DC | PRN
  Administered 2014-05-18: 500 mg via INTRAVENOUS
  Filled 2014-05-18 (×3): qty 5

## 2014-05-18 MED ORDER — ALUM & MAG HYDROXIDE-SIMETH 200-200-20 MG/5ML PO SUSP: 30.0000 mL | Freq: Four times a day (QID) | ORAL | Status: DC | PRN

## 2014-05-18 MED ORDER — NEOSTIGMINE METHYLSULFATE 10 MG/10ML IV SOLN
INTRAVENOUS | Status: DC | PRN
  Administered 2014-05-18: 4 mg via INTRAVENOUS

## 2014-05-18 MED ORDER — SODIUM CHLORIDE 0.9 % IJ SOLN
3.0000 mL | Freq: Two times a day (BID) | INTRAMUSCULAR | Status: DC
  Administered 2014-05-18: 3 mL via INTRAVENOUS

## 2014-05-18 MED ORDER — SODIUM CHLORIDE 0.9 % IV SOLN: 250.0000 mL | INTRAVENOUS | Status: DC

## 2014-05-18 MED ORDER — SENNOSIDES-DOCUSATE SODIUM 8.6-50 MG PO TABS: 1.0000 | ORAL_TABLET | Freq: Every evening | ORAL | Status: DC | PRN

## 2014-05-18 MED ORDER — ACETAMINOPHEN 10 MG/ML IV SOLN
1000.0000 mg | Freq: Once | INTRAVENOUS | Status: AC
  Administered 2014-05-18: 1000 mg via INTRAVENOUS
  Filled 2014-05-18: qty 100

## 2014-05-18 MED ORDER — PROMETHAZINE HCL 25 MG PO TABS: 12.5000 mg | ORAL_TABLET | Freq: Four times a day (QID) | ORAL | Status: DC | PRN

## 2014-05-18 MED ORDER — DEXAMETHASONE SODIUM PHOSPHATE 10 MG/ML IJ SOLN
INTRAMUSCULAR | Status: AC
  Filled 2014-05-18: qty 1

## 2014-05-18 MED ORDER — ONDANSETRON HCL 4 MG/2ML IJ SOLN
INTRAMUSCULAR | Status: AC
  Filled 2014-05-18: qty 2

## 2014-05-18 MED ORDER — HYDROMORPHONE HCL 1 MG/ML IJ SOLN
0.2500 mg | INTRAMUSCULAR | Status: DC | PRN
  Administered 2014-05-18 (×3): 0.5 mg via INTRAVENOUS

## 2014-05-18 MED ORDER — EPHEDRINE SULFATE 50 MG/ML IJ SOLN
INTRAMUSCULAR | Status: AC
  Filled 2014-05-18: qty 1

## 2014-05-18 MED ORDER — CEFAZOLIN SODIUM-DEXTROSE 2-3 GM-% IV SOLR
2.0000 g | Freq: Three times a day (TID) | INTRAVENOUS | Status: AC
  Administered 2014-05-18 – 2014-05-19 (×3): 2 g via INTRAVENOUS
  Filled 2014-05-18 (×4): qty 50

## 2014-05-18 MED ORDER — SCOPOLAMINE 1 MG/3DAYS TD PT72
MEDICATED_PATCH | TRANSDERMAL | Status: AC
Start: 2014-05-18 — End: 2014-05-18
  Filled 2014-05-18: qty 1

## 2014-05-18 MED ORDER — EPHEDRINE SULFATE 50 MG/ML IJ SOLN
INTRAMUSCULAR | Status: DC | PRN
  Administered 2014-05-18: 10 mg via INTRAVENOUS

## 2014-05-18 MED ORDER — SUCCINYLCHOLINE CHLORIDE 20 MG/ML IJ SOLN
INTRAMUSCULAR | Status: DC | PRN
  Administered 2014-05-18: 100 mg via INTRAVENOUS

## 2014-05-18 MED ORDER — GLYCOPYRROLATE 0.2 MG/ML IJ SOLN
INTRAMUSCULAR | Status: AC
  Filled 2014-05-18: qty 3

## 2014-05-18 MED ORDER — HYDROCODONE-ACETAMINOPHEN 5-325 MG PO TABS
1.0000 | ORAL_TABLET | ORAL | Status: DC | PRN
  Administered 2014-05-19: 2 via ORAL
  Filled 2014-05-18: qty 2

## 2014-05-18 MED ORDER — MAGNESIUM CITRATE PO SOLN: 1.0000 | Freq: Once | ORAL | Status: AC | PRN

## 2014-05-18 MED ORDER — METHOCARBAMOL 500 MG PO TABS
500.0000 mg | ORAL_TABLET | Freq: Four times a day (QID) | ORAL | Status: DC | PRN
  Filled 2014-05-18: qty 1

## 2014-05-18 MED ORDER — ONDANSETRON HCL 4 MG/2ML IJ SOLN
INTRAMUSCULAR | Status: DC | PRN
  Administered 2014-05-18: 4 mg via INTRAVENOUS

## 2014-05-18 MED ORDER — CEFAZOLIN SODIUM-DEXTROSE 2-3 GM-% IV SOLR
2.0000 g | INTRAVENOUS | Status: AC
  Administered 2014-05-18: 2 g via INTRAVENOUS

## 2014-05-18 MED ORDER — MIDAZOLAM HCL 5 MG/5ML IJ SOLN
INTRAMUSCULAR | Status: DC | PRN
  Administered 2014-05-18: 1 mg via INTRAVENOUS

## 2014-05-18 MED ORDER — SODIUM CHLORIDE 0.9 % IJ SOLN
INTRAMUSCULAR | Status: AC
  Filled 2014-05-18: qty 10

## 2014-05-18 MED ORDER — GLYCOPYRROLATE 0.2 MG/ML IJ SOLN
INTRAMUSCULAR | Status: DC | PRN
  Administered 2014-05-18: 0.6 mg via INTRAVENOUS

## 2014-05-18 MED ORDER — POLYMYXIN B SULFATE 500000 UNITS IJ SOLR
INTRAMUSCULAR | Status: DC | PRN
  Administered 2014-05-18: 500 mL

## 2014-05-18 MED ORDER — PROMETHAZINE HCL 25 MG/ML IJ SOLN: 6.2500 mg | INTRAMUSCULAR | Status: DC | PRN

## 2014-05-18 MED ORDER — HYDROMORPHONE HCL 1 MG/ML IJ SOLN: 0.5000 mg | INTRAMUSCULAR | Status: DC | PRN

## 2014-05-18 MED ORDER — ACETAMINOPHEN 650 MG RE SUPP: 650.0000 mg | RECTAL | Status: DC | PRN

## 2014-05-18 MED ORDER — BISACODYL 5 MG PO TBEC: 5.0000 mg | DELAYED_RELEASE_TABLET | Freq: Every day | ORAL | Status: DC | PRN

## 2014-05-18 MED ORDER — BUPIVACAINE-EPINEPHRINE 0.5% -1:200000 IJ SOLN
INTRAMUSCULAR | Status: DC | PRN
  Administered 2014-05-18: 15 mL

## 2014-05-18 MED ORDER — PROPOFOL 10 MG/ML IV BOLUS
INTRAVENOUS | Status: AC
  Filled 2014-05-18: qty 20

## 2014-05-18 MED ORDER — HYDROMORPHONE HCL 1 MG/ML IJ SOLN
INTRAMUSCULAR | Status: AC
  Administered 2014-05-18: 0.5 mg via INTRAVENOUS
  Filled 2014-05-18: qty 1

## 2014-05-18 MED ORDER — FENTANYL CITRATE 0.05 MG/ML IJ SOLN
INTRAMUSCULAR | Status: DC | PRN
  Administered 2014-05-18: 100 ug via INTRAVENOUS
  Administered 2014-05-18: 25 ug via INTRAVENOUS
  Administered 2014-05-18: 50 ug via INTRAVENOUS
  Administered 2014-05-18: 25 ug via INTRAVENOUS

## 2014-05-18 MED ORDER — FENTANYL CITRATE 0.05 MG/ML IJ SOLN
INTRAMUSCULAR | Status: AC
  Filled 2014-05-18: qty 5

## 2014-05-18 SURGICAL SUPPLY — 46 items
BAG ZIPLOCK 12X15 (MISCELLANEOUS) IMPLANT
CLEANER TIP ELECTROSURG 2X2 (MISCELLANEOUS) ×2 IMPLANT
CLOTH 2% CHLOROHEXIDINE 3PK (PERSONAL CARE ITEMS) ×2 IMPLANT
DRAPE MICROSCOPE LEICA (MISCELLANEOUS) ×2 IMPLANT
DRAPE POUCH INSTRU U-SHP 10X18 (DRAPES) ×2 IMPLANT
DRAPE SURG 17X11 SM STRL (DRAPES) ×2 IMPLANT
DRAPE UTILITY XL STRL (DRAPES) ×4 IMPLANT
DRSG AQUACEL AG ADV 3.5X 4 (GAUZE/BANDAGES/DRESSINGS) IMPLANT
DRSG AQUACEL AG ADV 3.5X 6 (GAUZE/BANDAGES/DRESSINGS) ×2 IMPLANT
DRSG TELFA 3X8 NADH (GAUZE/BANDAGES/DRESSINGS) ×2 IMPLANT
DURAPREP 26ML APPLICATOR (WOUND CARE) ×2 IMPLANT
DURASEAL SPINE SEALANT 3ML (MISCELLANEOUS) IMPLANT
ELECT BLADE TIP CTD 4 INCH (ELECTRODE) IMPLANT
ELECT REM PT RETURN 9FT ADLT (ELECTROSURGICAL) ×2
ELECTRODE REM PT RTRN 9FT ADLT (ELECTROSURGICAL) ×1 IMPLANT
GLOVE BIOGEL PI IND STRL 7.5 (GLOVE) ×1 IMPLANT
GLOVE BIOGEL PI INDICATOR 7.5 (GLOVE) ×1
GLOVE SURG SS PI 7.5 STRL IVOR (GLOVE) ×2 IMPLANT
GLOVE SURG SS PI 8.0 STRL IVOR (GLOVE) ×4 IMPLANT
GOWN STRL REUS W/TWL XL LVL3 (GOWN DISPOSABLE) ×4 IMPLANT
IV CATH 14GX2 1/4 (CATHETERS) ×2 IMPLANT
KIT BASIN OR (CUSTOM PROCEDURE TRAY) ×2 IMPLANT
KIT POSITIONING SURG ANDREWS (MISCELLANEOUS) ×2 IMPLANT
MANIFOLD NEPTUNE II (INSTRUMENTS) ×2 IMPLANT
NEEDLE SPNL 18GX3.5 QUINCKE PK (NEEDLE) ×6 IMPLANT
PACK LAMINECTOMY ORTHO (CUSTOM PROCEDURE TRAY) ×2 IMPLANT
PATTIES SURGICAL .5 X.5 (GAUZE/BANDAGES/DRESSINGS) IMPLANT
PATTIES SURGICAL .75X.75 (GAUZE/BANDAGES/DRESSINGS) ×2 IMPLANT
PATTIES SURGICAL 1X1 (DISPOSABLE) IMPLANT
RUBBERBAND STERILE (MISCELLANEOUS) ×2 IMPLANT
SPONGE SURGIFOAM ABS GEL 100 (HEMOSTASIS) ×2 IMPLANT
STAPLER VISISTAT (STAPLE) IMPLANT
STRIP CLOSURE SKIN 1/2X4 (GAUZE/BANDAGES/DRESSINGS) ×2 IMPLANT
SUT NURALON 4 0 TR CR/8 (SUTURE) IMPLANT
SUT PROLENE 3 0 PS 2 (SUTURE) ×2 IMPLANT
SUT VIC AB 1 CT1 27 (SUTURE) ×1
SUT VIC AB 1 CT1 27XBRD ANTBC (SUTURE) ×1 IMPLANT
SUT VIC AB 1-0 CT2 27 (SUTURE) IMPLANT
SUT VIC AB 2-0 CT1 27 (SUTURE) ×1
SUT VIC AB 2-0 CT1 TAPERPNT 27 (SUTURE) ×1 IMPLANT
SUT VIC AB 2-0 CT2 27 (SUTURE) ×2 IMPLANT
SYR 3ML LL SCALE MARK (SYRINGE) IMPLANT
TOWEL OR 17X26 10 PK STRL BLUE (TOWEL DISPOSABLE) ×2 IMPLANT
TOWEL OR NON WOVEN STRL DISP B (DISPOSABLE) IMPLANT
TRAY FOLEY CATH 14FRSI W/METER (CATHETERS) ×2 IMPLANT
YANKAUER SUCT BULB TIP NO VENT (SUCTIONS) ×2 IMPLANT

## 2014-05-18 NOTE — Discharge Instructions (Signed)

## 2014-05-18 NOTE — Transfer of Care (Signed)
Immediate Anesthesia Transfer of Care Note  Patient: Jill Mcguire  Procedure(s) Performed: Procedure(s) (LRB): MICRO LUMBER DECOMPRESSION L4-5 (N/A)  Patient Location: PACU  Anesthesia Type: General  Level of Consciousness: sedated, patient cooperative and responds to stimulation  Airway & Oxygen Therapy: Patient Spontanous Breathing and Patient connected to face mask oxgen  Post-op Assessment: Report given to PACU RN and Post -op Vital signs reviewed and stable  Post vital signs: Reviewed and stable  Complications: No apparent anesthesia complications

## 2014-05-18 NOTE — Brief Op Note (Signed)
05/18/2014  9:23 AM  PATIENT:  Jill Mcguire  64 y.o. female  PRE-OPERATIVE DIAGNOSIS:  H&P L4-5  POST-OPERATIVE DIAGNOSIS:  herniated nucleus pulposus lumbar four- five  PROCEDURE:  Procedure(s): MICRO LUMBER DECOMPRESSION L4-5 (N/A)  SURGEON:  Surgeon(s) and Role:    * Johnn Hai, MD - Primary  PHYSICIAN ASSISTANT:   ASSISTANTS: [place]   ANESTHESIA:   general  EBL:  Total I/O In: 1000 [I.V.:1000] Out: 75 [Urine:75]  BLOOD ADMINISTERED:none  DRAINS: none   LOCAL MEDICATIONS USED:  MARCAINE     SPECIMEN:  Source of Specimen:  L45  DISPOSITION OF SPECIMEN:  PATHOLOGY  COUNTS:  YES  TOURNIQUET:  * No tourniquets in log *  DICTATION: .Other Dictation: Dictation Number [id number]  PLAN OF CARE: Admit for overnight observation  PATIENT DISPOSITION:  PACU - hemodynamically stable.   Delay start of Pharmacological VTE agent (>24hrs) due to surgical blood loss or risk of bleeding: yes

## 2014-05-18 NOTE — Interval H&P Note (Signed)
History and Physical Interval Note:  05/18/2014 7:32 AM  Jill Mcguire  has presented today for surgery, with the diagnosis of H&P L4-5  The various methods of treatment have been discussed with the patient and family. After consideration of risks, benefits and other options for treatment, the patient has consented to  Procedure(s): MICRO LUMBER DECOMPRESSION L4-5 (N/A) as a surgical intervention .  The patient's history has been reviewed, patient examined, no change in status, stable for surgery.  I have reviewed the patient's chart and labs.  Questions were answered to the patient's satisfaction.     Cole Klugh C

## 2014-05-18 NOTE — Anesthesia Procedure Notes (Signed)
Procedure Name: Intubation Date/Time: 05/18/2014 7:48 AM Performed by: Montel Clock Pre-anesthesia Checklist: Patient identified, Emergency Drugs available, Suction available and Patient being monitored Patient Re-evaluated:Patient Re-evaluated prior to inductionOxygen Delivery Method: Circle system utilized Preoxygenation: Pre-oxygenation with 100% oxygen Intubation Type: IV induction Ventilation: Mask ventilation without difficulty Laryngoscope Size: Mac and 3 Grade View: Grade I Tube type: Oral Tube size: 7.5 mm Number of attempts: 1 Airway Equipment and Method: Stylet Placement Confirmation: ETT inserted through vocal cords under direct vision,  positive ETCO2,  CO2 detector and breath sounds checked- equal and bilateral Secured at: 22 cm Tube secured with: Tape Dental Injury: Teeth and Oropharynx as per pre-operative assessment

## 2014-05-18 NOTE — Anesthesia Preprocedure Evaluation (Addendum)
Anesthesia Evaluation  Patient identified by MRN, date of birth, ID band Patient awake    Reviewed: Allergy & Precautions, NPO status , Patient's Chart, lab work & pertinent test results, reviewed documented beta blocker date and time   History of Anesthesia Complications (+) PONV and history of anesthetic complications  Airway Mallampati: I  TM Distance: >3 FB Neck ROM: Full    Dental  (+) Teeth Intact, Dental Advisory Given   Pulmonary former smoker (quit 2010),  breath sounds clear to auscultation        Cardiovascular hypertension, Pt. on medications - anginaRhythm:Regular Rate:Normal     Neuro/Psych Chronic back pain    GI/Hepatic Neg liver ROS, GERD-  Controlled,  Endo/Other  negative endocrine ROS  Renal/GU negative Renal ROS     Musculoskeletal   Abdominal   Peds  Hematology negative hematology ROS (+)   Anesthesia Other Findings   Reproductive/Obstetrics                            Anesthesia Physical Anesthesia Plan  ASA: II  Anesthesia Plan: General   Post-op Pain Management:    Induction: Intravenous  Airway Management Planned: Oral ETT  Additional Equipment:   Intra-op Plan:   Post-operative Plan: Extubation in OR  Informed Consent: I have reviewed the patients History and Physical, chart, labs and discussed the procedure including the risks, benefits and alternatives for the proposed anesthesia with the patient or authorized representative who has indicated his/her understanding and acceptance.   Dental advisory given  Plan Discussed with: CRNA and Surgeon  Anesthesia Plan Comments: (Plan routine monitors, GETA)        Anesthesia Quick Evaluation

## 2014-05-18 NOTE — H&P (View-Only) (Signed)
Jill Mcguire is an 64 y.o. female.   Chief Complaint: back and leg pain HPI: The patient is a 64 year old female who presents with back pain. The patient reports low back symptoms which began 11 day(s) ago in association with an established activity. The activity involved that occurred at home while doing prone hip extension exercises (seen by Richardson Landry 03/24/14 and was given hip stretches and strengthening exercises). Symptoms are reported to be located in the left low back and Symptoms include pain and burning. The pain radiates to the left buttock, left thigh, left posterior thigh and right lateral thigh. Symptoms are exacerbated by standing and direct pressure. Symptoms are relieved by rest, while symptoms are not relieved by opioid analgesics. Current treatment includes opioid analgesics, muscle relaxants and heating pad. Pertinent medical history includes previous back surgery (decompression right L5-S1 August 2008 by Dr. Tonita Cong). This problem has not been previously evaluated.   Past Medical History  Diagnosis Date  . Breast CA 2000    right breast  . Anxiety   . GERD (gastroesophageal reflux disease)   . Hypertension   . Hyperlipidemia     Past Surgical History  Procedure Laterality Date  . Breast lumpectomy  2001    lymph node dissection  . Appendectomy  1982  . Salpingoophorectomy  1982    right  . Anterior cervical laminectomy  1996  . Right eye muscel dissection  1995  . Lumbar disc surgery  2008    L4-L5  . Vaginal hysterectomy N/A 01/26/2013    Procedure: HYSTERECTOMY VAGINAL;  Surgeon: Jonnie Kind, MD;  Location: [facility];  Service: Gynecology;  Laterality: N/A;  . Anterior and posterior repair N/A 01/26/2013    Procedure: ANTERIOR (CYSTOCELE) AND POSTERIOR REPAIR (RECTOCELE);  Surgeon: Jonnie Kind, MD;  Location: [facility];  Service: Gynecology;  Laterality: N/A;  . Oophorectomy Left 01/26/2013    Procedure: OOPHORECTOMY;  Surgeon: Jonnie Kind, MD;  Location: [facility];  Service: Gynecology;  Laterality: Left;    Family History  Problem Relation Age of Onset  . Diabetes Mother   . Hypertension Mother   . Stroke Mother   . Cancer - Lung Father   . Cancer - Lung Brother   . Cancer - Lung Brother   . Heart attack Brother   . Heart attack Brother    Social History:  reports that she quit smoking about 5 years ago. She has never used smokeless tobacco. She reports that she drinks alcohol. She reports that she does not use illicit drugs.  Allergies:  Allergies  Allergen Reactions  . Darvocet [Propoxyphene N-Acetaminophen] Rash  . Epinephrine     Rapid heart rate, decreased BP after local anesthetic injection to skin 30 yrs ago Tolerated Marcaine&Epi completely 01/26/13 for Ant & Post Vag repair.  . Tagamet [Cimetidine] Rash  . Tetanus Toxoids Rash     (Not in a hospital admission)  No results found for this or any previous visit (from the past 48 hour(s)). No results found.  Review of Systems  Constitutional: Negative.   HENT: Negative.   Eyes: Negative.   Respiratory: Negative.   Cardiovascular: Negative.   Gastrointestinal: Negative.   Genitourinary: Negative.   Musculoskeletal: Positive for back pain.  Skin: Negative.   Neurological: Positive for focal weakness.  Psychiatric/Behavioral: Negative.     There were no vitals taken for this visit. Physical Exam  Constitutional: She is oriented to person, place, and time. She appears well-developed and  well-nourished.  HENT:  Head: Normocephalic and atraumatic.  Eyes: Conjunctivae and EOM are normal. Pupils are equal, round, and reactive to light.  Neck: Normal range of motion. Neck supple.  Cardiovascular: Normal rate and regular rhythm.   Respiratory: Effort normal and breath sounds normal.  GI: Soft. Bowel sounds are normal.  Musculoskeletal:  She is upright. Minimal distress. Straight leg raise on the left produces low back and buttock pain. On the right it is negative.  Trace EHL weakness. Limited extension and flexion of the lumbar spine.  Lumbar spine exam reveals no evidence of soft tissue swelling, ecchymosis or deformity. The abdomen is soft and nontender. Nontender over the trochanters. No cellulitis or lymphadenopathy. Good range of motion of the lumbar spine without associated pain. Straight leg raise is negative. Motor is 5/5 including EHL, tibialis anterior, plantar flexion, quadriceps and hamstrings. Patient is normoreflexic. There is no Babinski or clonus. Sensory exam is intact to light touch. The patient has good distal pulses. No DVT. No pain and normal range of motion without instability of the hips, knees and ankles.  Neurological: She is alert and oriented to person, place, and time. She has normal reflexes.  Skin: Skin is warm and dry.  Psychiatric: She has a normal mood and affect.    X-rays demonstrate disk degeneration of L5-S1, neural foraminal stenosis, mild osteoarthrosis of the hips.  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 Compression of the thecal sac and generating severe spinal stenosis. I called her at home. She is having severe pain in her buttock and leg. I indicated to her she would wise to consider lumbar decompression and she would like to proceed with that. I filled out a surgical flow sheet for Judeen Hammans to contact her. Ms. Santini will see me on Thursday for further consultation. This is to expidite the situation given the compression of the thecal sac. She has had no problems with her bowel or bladder function at this point though. If she does I have instructed her to call. We discussed signs and symptoms of that.  Plan microlumbar decompression L4-5  Tiah Heckel M. PA-C for Dr. Tonita Cong 05/05/2014, 1:27 PM

## 2014-05-18 NOTE — Anesthesia Postprocedure Evaluation (Signed)
  Anesthesia Post-op Note  Patient: Jill Mcguire  Procedure(s) Performed: Procedure(s): MICRO LUMBER DECOMPRESSION L4-5 (N/A)  Patient Location: PACU  Anesthesia Type:General  Level of Consciousness: awake, alert , oriented and patient cooperative  Airway and Oxygen Therapy: Patient Spontanous Breathing and Patient connected to nasal cannula oxygen  Post-op Pain: mild  Post-op Assessment: Post-op Vital signs reviewed, Patient's Cardiovascular Status Stable, Respiratory Function Stable, Patent Airway, No signs of Nausea or vomiting and Pain level controlled  Post-op Vital Signs: Reviewed and stable  Last Vitals:  Filed Vitals:   05/18/14 1030  BP: 136/59  Pulse: 71  Temp: 36.7 C  Resp: 13    Complications: No apparent anesthesia complications

## 2014-05-19 ENCOUNTER — Encounter (HOSPITAL_COMMUNITY): Payer: Self-pay | Admitting: Specialist

## 2014-05-19 DIAGNOSIS — M4806 Spinal stenosis, lumbar region: Secondary | ICD-10-CM | POA: Diagnosis not present

## 2014-05-19 NOTE — Evaluation (Addendum)
Physical Therapy Evaluation Patient Details Name: Jill Mcguire MRN: [id number] DOB: [birth year] Today's Date: 05/19/2014   History of Present Illness  Microlumbar decompression L4-5 bilaterally  Clinical Impression  Patient is moving well, no needs.    Follow Up Recommendations No PT follow up    Equipment Recommendations  None recommended by PT    Recommendations for Other Services       Precautions / Restrictions Precautions Precautions: Back Precaution Comments: precautions given Restrictions Weight Bearing Restrictions: No      Mobility  Bed Mobility Overal bed mobility: Modified Independent                Transfers Overall transfer level: Needs assistance Equipment used: None Transfers: Sit to/from Stand Sit to Stand: Supervision            Ambulation/Gait Ambulation/Gait assistance: Supervision Ambulation Distance (Feet): 500 Feet Assistive device: None Gait Pattern/deviations: Step-through pattern        Stairs Stairs: Yes   Stair Management: No rails;Step to pattern;Forwards Number of Stairs: 1    Wheelchair Mobility    Modified Rankin (Stroke Patients Only)       Balance                                             Pertinent Vitals/Pain Pain Assessment: No/denies pain    Home Living Family/patient expects to be discharged to:: Private residence Living Arrangements: Spouse/significant other Available Help at Discharge: Family Type of Home: House Home Access: Stairs to enter   Technical brewer of Steps: 1 Home Layout: Two level;Able to live on main level with bedroom/bathroom Home Equipment: Cane - single point      Prior Function Level of Independence: Independent               Hand Dominance        Extremity/Trunk Assessment               Lower Extremity Assessment: LLE deficits/detail   LLE Deficits / Details: decreased great toe extension, numbness of that toe     Communication   Communication: No difficulties  Cognition Arousal/Alertness: Awake/alert Behavior During Therapy: WFL for tasks assessed/performed Overall Cognitive Status: Within Functional Limits for tasks assessed                      General Comments      Exercises        Assessment/Plan    PT Assessment Patent does not need any further PT services  PT Diagnosis Difficulty walking   PT Problem List    PT Treatment Interventions     PT Goals (Current goals can be found in the Care Plan section) Acute Rehab PT Goals PT Goal Formulation: All assessment and education complete, DC therapy    Frequency     Barriers to discharge        Co-evaluation               End of Session   Activity Tolerance: Patient tolerated treatment well Patient left: in chair Nurse Communication: Mobility status    Functional Assessment Tool Used: clinical reasoning Functional Limitation: Mobility: Walking and moving around Mobility: Walking and Moving Around Current Status (O0370): At least 1 percent but less than 20 percent impaired, limited or restricted Mobility: Walking and Moving Around Goal Status [phone]): At least 1  percent but less than 20 percent impaired, limited or restricted Mobility: Walking and Moving Around Discharge Status [phone]): At least 1 percent but less than 20 percent impaired, limited or restricted    Time: 1125-1136 PT Time Calculation (min) (ACUTE ONLY): 11 min   Charges:   PT Evaluation $Initial PT Evaluation Tier I: 1 Procedure PT Treatments    PT G Codes:   PT G-Codes **NOT FOR INPATIENT CLASS** Functional Assessment Tool Used: clinical reasoning Functional Limitation: Mobility: Walking and moving around Mobility: Walking and Moving Around Current Status (Q3300): At least 1 percent but less than 20 percent impaired, limited or restricted Mobility: Walking and Moving Around Goal Status [phone]): At least 1 percent but less than 20  percent impaired, limited or restricted Mobility: Walking and Moving Around Discharge Status [phone]): At least 1 percent but less than 20 percent impaired, limited or restricted    Claretha Cooper 05/19/2014, 2:00 PM

## 2014-05-19 NOTE — Op Note (Signed)
Jill Mcguire, BRADHAM NO.:  [id number]  MEDICAL RECORD NO.:  [id number]  LOCATION:  3500                         FACILITY:  [facility]  PHYSICIAN:  Susa Day, M.D.    DATE OF BIRTH:  Dec 20, 1950  DATE OF PROCEDURE:  05/18/2014 DATE OF DISCHARGE:                              OPERATIVE REPORT   PREOPERATIVE DIAGNOSIS:  Spinal stenosis, herniated nucleus pulposus at L4-5.  POSTOPERATIVE DIAGNOSIS:  Spinal stenosis, herniated nucleus pulposus at L4-5.  PROCEDURES PERFORMED: 1. Microlumbar decompression L4-5 bilaterally. 2. Foraminotomies at L4 and L5 bilaterally.  ANESTHESIA:  General.  SURGEON:  Susa Day, M.D.  ASSISTANT:  Cleophas Dunker, PA.  HISTORY:  A 64 year old with bilateral lower extremity radicular pain, left greater than right.  EHL weakness secondary to severe stenosis, disk herniation, facet ligamentum flavum hypertrophy indicated for decompression, failing conservative treatment.  Risks and benefits discussed including bleeding, infection, damage to neurovascular structures, DVT, PE, anesthetic complications, recurrent disk need for fusion in the future, etc.  TECHNIQUE:  With the patient in supine position after induction of adequate general anesthesia, 2 g Kefzol, placed prone on the [place] frame.  All bony prominences well padded.  Lumbar region was prepped and draped in usual sterile fashion.  Two 18-gauge spinal needles utilized to localize L4-5 interspace, confirmed with x-ray.  Incision was made from spinous process L4-5.  Subcutaneous tissue was dissected. Electrocautery was utilized to achieve hemostasis.  0.25% Marcaine with epinephrine was infiltrated in a very muscular tissue.  Dorsolumbar fascia identified and divided in line with skin incision.  Paraspinous muscle elevated from lamina of L4-5.  McCullough retractor was placed. Operating microscope was draped and brought into surgical field. Confirmatory radiograph  obtained at L4-5.  Removed part of the spinous process of L4 and L5 with a Leksell rongeur.  We performed a hemilaminotomy of the caudad edge of L4 bilaterally with a 2 and 3 mm Kerrison, cephalad to detach the ligamentum flavum.  Neuro patty placed beneath the ligamentum flavum.  Used a straight curette to detach the ligamentum flavum from the cephalad edge of L5.  Neuro patty placed beneath the ligamentum flavum.  I performed bilateral hemilaminotomies here and foraminotomies of L5.  There was severe stenosis bilaterally in the lateral recess and into the foramen.  On both sides of the operating room table, we used Durico and a Woodson retractor to decompress the lateral recesses to the medial border of the pedicle.  Severe lateral recess stenosis was noted bilaterally.  We performed foraminotomies of L4 bilaterally as well.  Focal HNP was noted on the right after mobilizing the thecal sac medially and protecting it, I performed an annulotomy.  Copious portion of the disk material was removed from the disk space with a straight and upbiting pituitary.  All herniated material was removed from the left side as well as, there was a disk herniation extended over to the left in a similar fashion, we mobilized the thecal sac.  There was some attenuation of thecal sac on this side secondary to stenosis.  No CSF leak was noted.  We performed an annulotomy and additional disk material was removed from the disk space with a straight and upbiting  pituitary.  We irrigated the disk space by catheter irrigation bilaterally and then removed additional fragments with a micropituitary.  Following this, there was excellent decompression of the L4-5 regions bilaterally.  There was no evidence of residual disk herniation.  We checked beneath thecal sac, the axilla of root and the shoulder of the root as well as foramen.  No residual neural compression noted.  No CSF leakage or active bleeding.  We obtained  a confirmatory radiograph of L4-5.  I then placed thrombin- soaked Gelfoam in the laminotomy defect.  Bone wax on the cancellous surfaces.  We removed the [facility] retractor, irrigated the paraspinous musculature, closed the dorsolumbar fascia with 1 Vicryl, subcutaneous with 2-0, and skin with 4-0 subcuticular Prolene.  Sterile dressing applied.  Placed supine on hospital bed, extubated without difficulty, and transported to the recovery room in satisfactory condition.  The patient tolerated the procedure well.  No complications.  Assistant is Cleophas Dunker, [place].  Minimal blood loss.     Susa Day, M.D.     Geralynn Rile  D:  05/18/2014  T:  05/19/2014  Job:  [id number]

## 2014-05-19 NOTE — Progress Notes (Signed)
Subjective: 1 Day Post-Op Procedure(s) (LRB): MICRO LUMBER DECOMPRESSION L4-5 (N/A) Patient reports pain as 3 on 0-10 scale.    Objective: Vital signs in last 24 hours: Temp:  [97.6 F (36.4 C)-99 F (37.2 C)] 97.7 F (36.5 C) (02/25 0601) Pulse Rate:  [59-80] 59 (02/25 0601) Resp:  [13-21] 14 (02/25 0601) BP: (108-136)/(51-113) 134/55 mmHg (02/25 0601) SpO2:  [98 %-100 %] 100 % (02/25 0601)  Intake/Output from previous day: 02/24 0701 - 02/25 0700 In: 3007 [P.O.:582; I.V.:2425] Out: 2900 [Urine:2800; Blood:100] Intake/Output this shift:    No results for input(s): HGB in the last 72 hours. No results for input(s): WBC, RBC, HCT, PLT in the last 72 hours. No results for input(s): NA, K, CL, CO2, BUN, CREATININE, GLUCOSE, CALCIUM in the last 72 hours. No results for input(s): LABPT, INR in the last 72 hours.  Neurologically intact Sensation intact distally Intact pulses distally Incision: dressing C/D/I Foley in Assessment/Plan: 1 Day Post-Op Procedure(s) (LRB): MICRO LUMBER DECOMPRESSION L4-5 (N/A) Advance diet Up with therapy D/C IV fluids Discharge home with home health after voids D/C foley D/c instructions given  Champagne Paletta C 05/19/2014, 9:52 AM

## 2014-05-19 NOTE — Discharge Summary (Signed)
Physician Discharge Summary   Patient ID: Jill Mcguire MRN: [id number] DOB/AGE: Feb 08, 1951 64 y.o.  Admit date: 05/18/2014 Discharge date: 05/19/2014  Primary Diagnosis:   H&P L4-5  Admission Diagnoses:  Past Medical History  Diagnosis Date  . Breast CA 2000    right breast  . Anxiety   . GERD (gastroesophageal reflux disease)   . Hypertension   . Hyperlipidemia   . PONV (postoperative nausea and vomiting)   . Cough 05/22/14    states drainage from head- "when I lay down- drainage is clear and no fever"   Discharge Diagnoses:   Principal Problem:   Spinal stenosis of lumbar region Active Problems:   Spinal stenosis at L4-L5 level  Procedure:  Procedure(s) (LRB): MICRO LUMBER DECOMPRESSION L4-5 (N/A)   Consults: None  HPI:  see H&P    Laboratory Data: Hospital Outpatient Visit on 01/20/2013  Component Date Value Ref Range Status  . Sodium 01/20/2013 139  135 - 145 mEq/L Final  . Potassium 01/20/2013 4.2  3.5 - 5.1 mEq/L Final  . Chloride 01/20/2013 101  96 - 112 mEq/L Final  . CO2 01/20/2013 27  19 - 32 mEq/L Final  . Glucose, Bld 01/20/2013 81  70 - 99 mg/dL Final  . BUN 01/20/2013 10  6 - 23 mg/dL Final  . Creatinine, Ser 01/20/2013 0.90  0.50 - 1.10 mg/dL Final  . Calcium 01/20/2013 9.5  8.4 - 10.5 mg/dL Final  . GFR calc non Af Amer 01/20/2013 66* >90 mL/min Final  . GFR calc Af Amer 01/20/2013 77* >90 mL/min Final   Comment: (NOTE)                          The eGFR has been calculated using the CKD EPI equation.                          This calculation has not been validated in all clinical situations.                          eGFR's persistently <90 mL/min signify possible Chronic Kidney                          Disease.  . WBC 01/20/2013 4.5  4.0 - 10.5 K/uL Final  . RBC 01/20/2013 4.10  3.87 - 5.11 MIL/uL Final  . Hemoglobin 01/20/2013 12.9  12.0 - 15.0 g/dL Final  . HCT 01/20/2013 38.9  36.0 - 46.0 % Final  . MCV 01/20/2013 94.9  78.0 -  100.0 fL Final  . MCH 01/20/2013 31.5  26.0 - 34.0 pg Final  . MCHC 01/20/2013 33.2  30.0 - 36.0 g/dL Final  . RDW 01/20/2013 12.5  11.5 - 15.5 % Final  . Platelets 01/20/2013 255  150 - 400 K/uL Final  . ABO/RH(D) 01/20/2013 B POS   Final  . Antibody Screen 01/20/2013 NEG   Final  . Sample Expiration 01/20/2013 02/03/2013   Final  . Color, Urine 01/20/2013 YELLOW  YELLOW Final  . APPearance 01/20/2013 CLEAR  CLEAR Final  . Specific Gravity, Urine 01/20/2013 1.015  1.005 - 1.030 Final  . pH 01/20/2013 6.5  5.0 - 8.0 Final  . Glucose, UA 01/20/2013 NEGATIVE  NEGATIVE mg/dL Final  . Hgb urine dipstick 01/20/2013 NEGATIVE  NEGATIVE Final  . Bilirubin Urine 01/20/2013 NEGATIVE  NEGATIVE Final  .  Ketones, ur 01/20/2013 NEGATIVE  NEGATIVE mg/dL Final  . Protein, ur 01/20/2013 NEGATIVE  NEGATIVE mg/dL Final  . Urobilinogen, UA 01/20/2013 0.2  0.0 - 1.0 mg/dL Final  . Nitrite 01/20/2013 NEGATIVE  NEGATIVE Final  . Leukocytes, UA 01/20/2013 NEGATIVE  NEGATIVE Final   MICROSCOPIC NOT DONE ON URINES WITH NEGATIVE PROTEIN, BLOOD, LEUKOCYTES, NITRITE, OR GLUCOSE <1000 mg/dL.   No results for input(s): HGB in the last 72 hours. No results for input(s): WBC, RBC, HCT, PLT in the last 72 hours. No results for input(s): NA, K, CL, CO2, BUN, CREATININE, GLUCOSE, CALCIUM in the last 72 hours. No results for input(s): LABPT, INR in the last 72 hours.  X-Rays:Dg Chest 2 View  05/12/2014   CLINICAL DATA:  Preop for HNP  EXAM: CHEST  2 VIEW  COMPARISON:  Chest x-ray of 11/20/2006  FINDINGS: No active infiltrate or effusion is seen. Mediastinal and hilar contours are unchanged. The heart is within normal limits in size. There are mild degenerative changes in the lower thoracic spine.  IMPRESSION: No active cardiopulmonary disease.   Electronically Signed   By: Ivar Drape M.D.   On: 05/12/2014 14:42   Dg Lumbar Spine 2-3 Views  05/12/2014   CLINICAL DATA:  Preop for herniated disc  EXAM: LUMBAR SPINE - 2-3  VIEW  COMPARISON:  Lumbar spine films of 11/20/2006 and 11/18/2006  FINDINGS: There has been progression of degenerative disc disease at L5-S1 with further loss of disc space and sclerosis with spurring. Slight loss of the intervertebral disc space at L3-4 is also noted. The remainder of disc spaces are unchanged. No compression deformity is seen. The SI joints are corticated.  IMPRESSION: Progression of degenerative disc disease at L5-S1, and to a lesser degree at L3-4.   Electronically Signed   By: Ivar Drape M.D.   On: 05/12/2014 14:41   Dg Spine Portable 1 View  05/18/2014   CLINICAL DATA:  For lumbar spine surgery at L4-5, localization  EXAM: PORTABLE SPINE - 1 VIEW  COMPARISON:  Lumbar spine film from earlier today  FINDINGS: An instrument is directed posteriorly toward the posterior aspect of the L5 vertebral body superiorly, near the L4-5 interspace for localization purposes.  IMPRESSION: Localization of L4-5 interspace on image 3.   Electronically Signed   By: Ivar Drape M.D.   On: 05/18/2014 09:20   Dg Spine Portable 1 View  05/18/2014   CLINICAL DATA:  64 year old female undergoing micro lumbar decompression at L4-L5  EXAM: PORTABLE SPINE - 1 VIEW  COMPARISON:  Radiograph obtained earlier today  FINDINGS: Single cross-table lateral radiograph of the spine demonstrates soft tissue spreaders posteriorly at the L4-L5 level. A single metallic probe overlies the inferior articulating process of L4.  IMPRESSION: Intraoperative localization radiographs as above.   Electronically Signed   By: Jacqulynn Cadet M.D.   On: 05/18/2014 08:37   Dg Spine Portable 1 View  05/18/2014   CLINICAL DATA:  64 year old female undergoing lumbar spine surgery. Initial encounter.  EXAM: PORTABLE SPINE - 1 VIEW  COMPARISON:  Preoperative films 05/12/2014.  FINDINGS: Normal lumbar segmentation depicted on comparison.  Intraoperative portable cross-table lateral view of the lumbar spine labeled image #1 at 0739 hours.   Two posterior needles are in place.  The more cephalad needle is adjacent to the superior L4 spinous process.  The more caudal needle is directed to the L4-L5 interspinous space.  IMPRESSION: Intraoperative localization as above.   Electronically Signed   By: Lemmie Evens  [place] Crane M.D.   On: 05/18/2014 08:31    EKG: Orders placed or performed in visit on 05/12/14  . EKG 12-Lead  . EKG 12-Lead     Hospital Course: Patient was admitted to [facility] and taken to the OR and underwent the above state procedure without complications.  Patient tolerated the procedure well and was later transferred to the recovery room and then to the orthopaedic floor for postoperative care.  They were given PO and IV analgesics for pain control following their surgery.  They were given 24 hours of postoperative antibiotics.   PT was consulted postop to assist with mobility and transfers.  The patient was allowed to be WBAT with therapy and was taught back precautions. Discharge planning was consulted to help with postop disposition and equipment needs.  Patient had a good night on the evening of surgery and started to get up OOB with therapy on day one. Patient was seen in rounds and was ready to go home on day one.  They were given discharge instructions and dressing directions.  They were instructed on when to follow up in the office with Dr. Tonita Cong.   Diet: Regular diet Activity:WBAT; Lspine precautions Follow-up:in 10-14 days Disposition - Home Discharged Condition: good   Discharge Instructions    Call MD / Call 911    Complete by:  As directed   If you experience chest pain or shortness of breath, CALL 911 and be transported to the hospital emergency room.  If you develope a fever above 101 F, pus (white drainage) or increased drainage or redness at the wound, or calf pain, call your surgeon's office.     Constipation Prevention    Complete by:  As directed   Drink plenty of fluids.  Prune juice may be helpful.   You may use a stool softener, such as Colace (over the counter) 100 mg twice a day.  Use MiraLax (over the counter) for constipation as needed.     Diet - low sodium heart healthy    Complete by:  As directed      Increase activity slowly as tolerated    Complete by:  As directed             Medication List    TAKE these medications        acetaminophen 500 MG tablet  Commonly known as:  TYLENOL  Take 100 mg by mouth every 6 (six) hours as needed for mild pain.     amLODipine 10 MG tablet  Commonly known as:  NORVASC  Take 10 mg by mouth every morning.     buPROPion 300 MG 24 hr tablet  Commonly known as:  WELLBUTRIN XL  Take 300 mg by mouth every morning.     docusate sodium 100 MG capsule  Commonly known as:  COLACE  Take 1 capsule (100 mg total) by mouth 2 (two) times daily as needed for mild constipation.     oxyCODONE-acetaminophen 5-325 MG per tablet  Commonly known as:  PERCOCET  Take 1 tablet by mouth every 4 (four) hours as needed.     pravastatin 20 MG tablet  Commonly known as:  PRAVACHOL  Take 20 mg by mouth daily.           Follow-up Information    Follow up with BEANE,JEFFREY C, MD In 2 weeks.   Specialty:  Orthopedic Surgery   Why:  For suture removal   Contact information:   [place]  [address] [address] [phone]       Signed: Lacie Draft, PA-C Orthopaedic Surgery 05/19/2014, 12:35 PM

## 2014-05-19 NOTE — Evaluation (Signed)
Occupational Therapy Evaluation Patient Details Name: Jill Mcguire MRN: [id number] DOB: October 29, 1950 Today's Date: 05/19/2014    History of Present Illness Microlumbar decompression L4-5 bilaterally   Clinical Impression   All OT education complete.      Follow Up Recommendations  No OT follow up    Equipment Recommendations  None recommended by OT    Recommendations for Other Services       Precautions / Restrictions Restrictions Weight Bearing Restrictions: No      Mobility Bed Mobility Overal bed mobility: Modified Independent                Transfers Overall transfer level: Needs assistance   Transfers: Sit to/from Stand Sit to Stand: Supervision              Balance                                            ADL Overall ADL's : Needs assistance/impaired             Lower Body Bathing: Sit to/from stand;Minimal assistance   Upper Body Dressing : Set up;Sitting   Lower Body Dressing: Set up;Sit to/from stand   Toilet Transfer: Supervision/safety   Toileting- Water quality scientist and Hygiene: Supervision/safety;Sit to/from stand       Functional mobility during ADLs: Set up                 Pertinent Vitals/Pain Pain Assessment: No/denies pain     Hand Dominance     Extremity/Trunk Assessment Upper Extremity Assessment Upper Extremity Assessment: Overall WFL for tasks assessed           Communication Communication Communication: No difficulties   Cognition Arousal/Alertness: Awake/alert Behavior During Therapy: WFL for tasks assessed/performed Overall Cognitive Status: Within Functional Limits for tasks assessed                     General Comments       Exercises       Shoulder Instructions      Home Living Family/patient expects to be discharged to:: Private residence Living Arrangements: Spouse/significant other Available Help at Discharge: Family Type of Home:  House Home Access: Stairs to enter     Home Layout: Two level;Able to live on main level with bedroom/bathroom     Bathroom Shower/Tub: Tub/shower unit;Curtain Shower/tub characteristics: Architectural technologist: Handicapped height Bathroom Accessibility: Yes   Home Equipment: [place] - single point          Prior Functioning/Environment               OT Diagnosis: Generalized weakness                End of Session Nurse Communication: Mobility status  Activity Tolerance: Patient tolerated treatment well Patient left: in chair;with call bell/phone within reach   Time: 1110-1125 OT Time Calculation (min): 15 min Charges:  OT General Charges $OT Visit: 1 Procedure OT Evaluation $Initial OT Evaluation Tier I: 1 Procedure G-Codes: OT G-codes **NOT FOR INPATIENT CLASS** Functional Assessment Tool Used: clinical observation Functional Limitation: Self care Self Care Current Status (J4782): At least 1 percent but less than 20 percent impaired, limited or restricted Self Care Goal Status (N5621): 0 percent impaired, limited or restricted Self Care Discharge Status [id number]): At least 1 percent but less than 20 percent impaired, limited  or restricted  Betsy Pries 05/19/2014, 11:39 AM

## 2014-05-22 DIAGNOSIS — R059 Cough, unspecified: Secondary | ICD-10-CM

## 2014-05-22 HISTORY — DX: Cough, unspecified: R05.9

## 2015-10-12 ENCOUNTER — Encounter: Payer: Self-pay | Admitting: Gastroenterology

## 2016-08-05 DIAGNOSIS — Z1231 Encounter for screening mammogram for malignant neoplasm of breast: Secondary | ICD-10-CM | POA: Insufficient documentation

## 2016-08-09 DIAGNOSIS — M898X1 Other specified disorders of bone, shoulder: Secondary | ICD-10-CM | POA: Insufficient documentation

## 2017-06-10 DIAGNOSIS — N6489 Other specified disorders of breast: Secondary | ICD-10-CM | POA: Insufficient documentation

## 2018-08-04 ENCOUNTER — Other Ambulatory Visit: Payer: Self-pay

## 2018-08-04 ENCOUNTER — Encounter: Payer: Self-pay | Admitting: Podiatry

## 2018-08-04 ENCOUNTER — Ambulatory Visit (INDEPENDENT_AMBULATORY_CARE_PROVIDER_SITE_OTHER): Payer: Medicare Other | Admitting: Podiatry

## 2018-08-04 VITALS — Temp 98.1°F

## 2018-08-04 DIAGNOSIS — M21619 Bunion of unspecified foot: Secondary | ICD-10-CM | POA: Diagnosis not present

## 2018-08-04 DIAGNOSIS — L600 Ingrowing nail: Secondary | ICD-10-CM

## 2018-08-04 NOTE — Patient Instructions (Signed)

## 2018-08-07 NOTE — Progress Notes (Signed)
Subjective:   Patient ID: Jill Mcguire, female   DOB: 68 y.o.   MRN: [id number]   HPI 68 year old female presents the office today with her main concern to have an ingrown toenail to left big toe, lateral aspect.  The area is painful with pressure in shoes.  She did try to cut and get a PCL however it still painful all the way down the side.  This is been on the for last 1 month.  No drainage pus.  She has been soaking.  She has secondary concerns of painful bunions to both feet the left side worse than the right.  This is been ongoing issue.  Ingrown toenails taken care of she wants to discuss plans for possible surgery.   Review of Systems  All other systems reviewed and are negative.  Past Medical History:  Diagnosis Date  . Anxiety   . Breast CA ([place]) 2000   right breast  . Cough 05/22/14   states drainage from head- "when I lay down- drainage is clear and no fever"  . GERD (gastroesophageal reflux disease)   . Hyperlipidemia   . Hypertension   . PONV (postoperative nausea and vomiting)     Past Surgical History:  Procedure Laterality Date  . ANTERIOR AND POSTERIOR REPAIR N/A 01/26/2013   Procedure: ANTERIOR (CYSTOCELE) AND POSTERIOR REPAIR (RECTOCELE);  Surgeon: Jonnie Kind, MD;  Location: [facility];  Service: Gynecology;  Laterality: N/A;  . anterior cervical laminectomy  1996  . APPENDECTOMY  1982  . BACK SURGERY    . BREAST LUMPECTOMY  2001   lymph node dissection  . EYE SURGERY Right    "muscle resection"/ cataract extraction with IOL  . LUMBAR DISC SURGERY  2008   L4-L5  . LUMBAR LAMINECTOMY/DECOMPRESSION MICRODISCECTOMY N/A 05/18/2014   Procedure: MICRO LUMBER DECOMPRESSION L4-5;  Surgeon: Johnn Hai, MD;  Location: [facility];  Service: Orthopedics;  Laterality: N/A;  . OOPHORECTOMY Left 01/26/2013   Procedure: OOPHORECTOMY;  Surgeon: Jonnie Kind, MD;  Location: [facility];  Service: Gynecology;  Laterality: Left;  . right eye muscel dissection  1995  .  SALPINGOOPHORECTOMY  1982   right  . VAGINAL HYSTERECTOMY N/A 01/26/2013   Procedure: HYSTERECTOMY VAGINAL;  Surgeon: Jonnie Kind, MD;  Location: [facility];  Service: Gynecology;  Laterality: N/A;     Current Outpatient Medications:  .  acetaminophen (TYLENOL) 500 MG tablet, Take 100 mg by mouth every 6 (six) hours as needed for mild pain., Disp: , Rfl:  .  amLODipine (NORVASC) 10 MG tablet, Take 10 mg by mouth every morning. , Disp: , Rfl:  .  buPROPion (WELLBUTRIN XL) 300 MG 24 hr tablet, Take 300 mg by mouth every morning. , Disp: , Rfl:  .  docusate sodium (COLACE) 100 MG capsule, Take 1 capsule (100 mg total) by mouth 2 (two) times daily as needed for mild constipation., Disp: 20 capsule, Rfl: 1 .  DULoxetine (CYMBALTA) 30 MG capsule, TAKE 1 CAPSULE BY MOUTH EVERY DAY WITH FOOD, Disp: , Rfl:  .  pravastatin (PRAVACHOL) 20 MG tablet, Take 20 mg by mouth daily., Disp: , Rfl:   Allergies  Allergen Reactions  . Darvocet [Propoxyphene N-Acetaminophen] Rash  . Epinephrine     Rapid heart rate, decreased BP after local anesthetic injection to skin 30 yrs ago Tolerated Marcaine&Epi completely 01/26/13 for Ant & Post Vag repair.  . Tagamet [Cimetidine] Rash  . Tetanus Toxoids Rash  Objective:  Physical Exam  General: AAO x3, NAD  Dermatological: Toenails hypertrophic, dystrophic with yellow-brown discoloration there is incurvation present on the lateral aspect left hallux toenail with tenderness palpation.  There is very minimal edema but there is no erythema or warmth.  No ascending cellulitis.  No drainage or pus.  No open lesions.  Vascular: Dorsalis Pedis artery and Posterior Tibial artery pedal pulses are 2/4 bilateral with immedate capillary fill time. There is no pain with calf compression, swelling, warmth, erythema.   Neruologic: Grossly intact via light touch bilateral. Vibratory intact via tuning fork bilateral. Protective threshold with Semmes Wienstein monofilament  intact to all pedal sites bilateral.  Musculoskeletal: Bony deformities present bilaterally with tenderness palpation along the bunion itself along the medial first metatarsal head.  No pain with MPJ range of motion.  No first ray hypermobility is present.  Muscular strength 5/5 in all groups tested bilateral.  Gait: Unassisted, Nonantalgic.      Assessment:   Symptomatic ingrown toenail left lateral hallux; bilateral bunion deformity     Plan:  -Treatment options discussed including all alternatives, risks, and complications -Etiology of symptoms were discussed -At this time, the patient is requesting partial nail removal with chemical matricectomy to the symptomatic portion of the nail. Risks and complications were discussed with the patient for which they understand and written consent was obtained. Under sterile conditions a total of 3 mL of a mixture of 2% lidocaine plain and 0.5% Marcaine plain was infiltrated in a hallux block fashion. Once anesthetized, the skin was prepped in sterile fashion. A tourniquet was then applied. Next the lateral aspect of hallux nail border was then sharply excised making sure to remove the entire offending nail border. Once the nails were ensured to be removed area was debrided and the underlying skin was intact. There is no purulence identified in the procedure. Next phenol was then applied under standard conditions and copiously irrigated. Silvadene was applied. A dry sterile dressing was applied. After application of the dressing the tourniquet was removed and there is found to be an immediate capillary refill time to the digit. The patient tolerated the procedure well any complications. Post procedure instructions were discussed the patient for which he verbally understood. Follow-up in one week for nail check or sooner if any problems are to arise. Discussed signs/symptoms of infection and directed to call the office immediately should any occur or go  directly to the emergency room. In the meantime, encouraged to call the office with any questions, concerns, changes symptoms.  *X appointment x-ray both feet for a new consult.  Trula Slade DPM

## 2018-08-11 ENCOUNTER — Encounter: Payer: Self-pay | Admitting: Podiatry

## 2018-08-11 ENCOUNTER — Other Ambulatory Visit: Payer: Self-pay

## 2018-08-11 ENCOUNTER — Ambulatory Visit: Payer: Medicare Other | Admitting: Podiatry

## 2018-08-11 ENCOUNTER — Ambulatory Visit (INDEPENDENT_AMBULATORY_CARE_PROVIDER_SITE_OTHER): Payer: Medicare Other

## 2018-08-11 VITALS — Temp 97.9°F

## 2018-08-11 DIAGNOSIS — M21612 Bunion of left foot: Secondary | ICD-10-CM | POA: Diagnosis not present

## 2018-08-11 DIAGNOSIS — M21619 Bunion of unspecified foot: Secondary | ICD-10-CM | POA: Diagnosis not present

## 2018-08-11 DIAGNOSIS — M21611 Bunion of right foot: Secondary | ICD-10-CM

## 2018-08-11 DIAGNOSIS — L600 Ingrowing nail: Secondary | ICD-10-CM

## 2018-08-11 NOTE — Patient Instructions (Signed)

## 2018-08-20 NOTE — Progress Notes (Signed)
Subjective: Jill Mcguire is a 68 y.o.  female returns to office today for follow up evaluation after having left Hallux partial nail avulsion performed. Patient has been soaking using epsom salts and applying topical antibiotic covered with bandaid daily.  She says overall feels well.  She is also been abdomen soft since she keeps the bandage off at nighttime.  She has discussed her bunions.  They are causing discomfort on a regular basis.  No recent treatment.  Patient denies fevers, chills, nausea, vomiting. Denies any calf pain, chest pain, SOB.   Objective:  Vitals: Reviewed  General: Well developed, nourished, in no acute distress, alert and oriented x3   Dermatology: Skin is warm, dry and supple bilateral. LEFT hallux nail border appears to be clean, dry, with mild granular tissue and surrounding scab. There is no surrounding erythema, edema, drainage/purulence. The remaining nails appear unremarkable at this time. There are no other lesions or other signs of infection present.  Neurovascular status: Intact. No lower extremity swelling; No pain with calf compression bilateral.  Musculoskeletal: No tenderness to palpation of the latereal hallux nail fold.  There is bony deformity present bilaterally is decreased range of motion of the first MPJ.  There is mild tenderness palpation on the medial aspect the first metatarsal head on the bunion deformity.  There is no other areas of tenderness elicited at this time. Muscular strength within normal limits bilateral.   Assesement and Plan: S/p partial nail avulsion, doing well; bilateral HAV, first MPJ arthritis  1.  Status post ingrown toenail removal -Continue soaking in epsom salts twice a day followed by antibiotic ointment and a band-aid. Can leave uncovered at night. Continue this until completely healed.  -If the area has not healed in 2 weeks, call the office for follow-up appointment, or sooner if any problems arise.  -Monitor for  any signs/symptoms of infection. Call the office immediately if any occur or go directly to the emergency room. Call with any questions/concerns.  2.  Bilateral bunion deformity -X-rays were obtained and reviewed.  Significant bunion formation present arthritic changes present in the first MPJ.  Long discussion regards to treatment options.  We discussed both conservative as well as surgical treatment options.  After discussion she is to continue with conservative care.  Discussed shoe modifications and orthotics.  We will check insurance coverage.  We discussed bunion surgery.  Given the arthritic changes she likely may benefit more from the first MPJ arthrodesis we also discussed a Lap[place]us bunionectomy in order to shorten the first metatarsal and try to save the first MPJ.  We discussed the recovery of the surgeries.  Celesta Gentile, DPM

## 2018-09-10 ENCOUNTER — Ambulatory Visit: Payer: Medicare Other | Admitting: Orthotics

## 2018-09-10 ENCOUNTER — Other Ambulatory Visit: Payer: Self-pay

## 2018-09-10 DIAGNOSIS — M21619 Bunion of unspecified foot: Secondary | ICD-10-CM

## 2018-09-10 NOTE — Progress Notes (Signed)
Jill Mcguire decided not to pursue getting f/o at this time.  This is mainly due to finding appropriate accomodating footwear, as she likes to wear dress flats.  I explained that those would render a best possible outcome as the f/o will have to incorporate a reverse morton's extension and that takes up room in a shoe.

## 2018-11-02 ENCOUNTER — Other Ambulatory Visit: Payer: Self-pay | Admitting: Internal Medicine

## 2018-11-02 DIAGNOSIS — Z1231 Encounter for screening mammogram for malignant neoplasm of breast: Secondary | ICD-10-CM

## 2018-11-12 ENCOUNTER — Other Ambulatory Visit: Payer: Self-pay

## 2018-11-12 ENCOUNTER — Ambulatory Visit
Admission: RE | Admit: 2018-11-12 | Discharge: 2018-11-12 | Disposition: A | Discharge status: Home / Self Care | Payer: Medicare Other | Source: Ambulatory Visit | Attending: Internal Medicine | Admitting: Internal Medicine

## 2018-11-12 DIAGNOSIS — Z1231 Encounter for screening mammogram for malignant neoplasm of breast: Secondary | ICD-10-CM

## 2019-02-01 LAB — IFOBT (OCCULT BLOOD): IFOBT: POSITIVE

## 2019-02-09 ENCOUNTER — Encounter: Payer: Self-pay | Admitting: Gastroenterology

## 2019-02-24 ENCOUNTER — Encounter: Payer: Self-pay | Admitting: Gastroenterology

## 2019-02-24 ENCOUNTER — Other Ambulatory Visit: Payer: Self-pay

## 2019-02-24 ENCOUNTER — Ambulatory Visit (INDEPENDENT_AMBULATORY_CARE_PROVIDER_SITE_OTHER): Payer: Medicare Other | Admitting: Gastroenterology

## 2019-02-24 VITALS — BP 118/60 | HR 88 | Temp 98.1°F | Ht 65.0 in | Wt 177.0 lb

## 2019-02-24 DIAGNOSIS — Z1159 Encounter for screening for other viral diseases: Secondary | ICD-10-CM | POA: Diagnosis not present

## 2019-02-24 DIAGNOSIS — R195 Other fecal abnormalities: Secondary | ICD-10-CM | POA: Diagnosis not present

## 2019-02-24 MED ORDER — NA SULFATE-K SULFATE-MG SULF 17.5-3.13-1.6 GM/177ML PO SOLN: 1.0000 | Freq: Once | ORAL | 0 refills | Status: AC

## 2019-02-24 NOTE — Progress Notes (Signed)
02/24/2019 KEAGAN TREZISE [id number] 07/29/1950   HISTORY OF PRESENT ILLNESS: This is a pleasant 68 year old female who is previously a patient of Dr. Nichola Sizer.  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  Recall was entered for 10-year follow-up.  She is here today at the request of her PCP, Dr. Dagmar Hait, due to a positive Hemosure study.  She denies any black or bloody stools.  Her hemoglobin is normal at 14 g.  She denies really any GI complaints at all including abdominal pain.  She says she moves her bowels regularly.  No upper GI complaints.   Past Medical History:  Diagnosis Date  . Anxiety   . Arthritis   . Breast CA ([place]) 2000   right breast  . Cough 05/22/14   states drainage from head- "when I lay down- drainage is clear and no fever"  . GERD (gastroesophageal reflux disease)   . Hyperlipidemia   . Hypertension   . Personal history of chemotherapy 2000   Right Breast Cancer  . Personal history of radiation therapy 2000   Right Breast Cancer  . PONV (postoperative nausea and vomiting)    Past Surgical History:  Procedure Laterality Date  . ANTERIOR AND POSTERIOR REPAIR N/A 01/26/2013   Procedure: ANTERIOR (CYSTOCELE) AND POSTERIOR REPAIR (RECTOCELE);  Surgeon: Jonnie Kind, MD;  Location: [facility];  Service: Gynecology;  Laterality: N/A;  . anterior cervical laminectomy  1996  . APPENDECTOMY  1982  . BREAST BIOPSY Right 1994, 2000  . BREAST LUMPECTOMY Right 2001   lymph node dissection  . EYE SURGERY Right    "muscle resection"/ cataract extraction with IOL  . LUMBAR DISC SURGERY  2008   L4-L5  . LUMBAR LAMINECTOMY/DECOMPRESSION MICRODISCECTOMY N/A 05/18/2014   Procedure: MICRO LUMBER DECOMPRESSION L4-5;  Surgeon: Johnn Hai, MD;  Location: [facility];  Service: Orthopedics;  Laterality: N/A;  . OOPHORECTOMY Left 01/26/2013   Procedure: OOPHORECTOMY;   Surgeon: Jonnie Kind, MD;  Location: [facility];  Service: Gynecology;  Laterality: Left;  . right eye muscel dissection  1995  . SALPINGOOPHORECTOMY  1982   right  . VAGINAL HYSTERECTOMY N/A 01/26/2013   Procedure: HYSTERECTOMY VAGINAL;  Surgeon: Jonnie Kind, MD;  Location: [facility];  Service: Gynecology;  Laterality: N/A;    reports that she quit smoking about 10 years ago. She has never used smokeless tobacco. She reports current alcohol use. She reports that she does not use drugs. family history includes Diabetes in her mother; Heart attack in her brother and brother; Hypertension in her mother; Lung cancer in her brother, brother, and father; Stroke in her mother. Allergies  Allergen Reactions  . Darvocet [Propoxyphene N-Acetaminophen] Rash  . Epinephrine     Rapid heart rate, decreased BP after local anesthetic injection to skin 30 yrs ago Tolerated Marcaine&Epi completely 01/26/13 for Ant & Post Vag repair.  . Tagamet [Cimetidine] Rash  . Tetanus Toxoids Rash      Outpatient Encounter Medications as of 02/24/2019  Medication Sig  . acetaminophen (TYLENOL) 500 MG tablet Take 100 mg by mouth every 6 (six) hours as needed for mild pain.  Marland Kitchen amLODipine (NORVASC) 10 MG tablet Take 10 mg by mouth every morning.   Marland Kitchen buPROPion (WELLBUTRIN XL) 150 MG 24 hr tablet Take 150 mg by mouth every morning.  . docusate sodium (COLACE) 100 MG capsule  Take 1 capsule (100 mg total) by mouth 2 (two) times daily as needed for mild constipation.  . fluticasone (FLONASE) 50 MCG/ACT nasal spray Place 2 sprays into both nostrils daily.  . pravastatin (PRAVACHOL) 20 MG tablet Take 20 mg by mouth daily.  . [DISCONTINUED] buPROPion (WELLBUTRIN XL) 300 MG 24 hr tablet Take 300 mg by mouth every morning.   . [DISCONTINUED] DULoxetine (CYMBALTA) 30 MG capsule TAKE 1 CAPSULE BY MOUTH EVERY DAY WITH FOOD   No facility-administered encounter medications on file as of 02/24/2019.      REVIEW OF SYSTEMS  : All  other systems reviewed and negative except where noted in the History of Present Illness.   PHYSICAL EXAM: Temp 98.1 F (36.7 C)   Ht 5\' 5"  (1.651 m) Comment: height measured without shoes  Wt 177 lb (80.3 kg)   BMI 29.45 kg/m  General: Well developed black female in no acute distress Head: Normocephalic and atraumatic Eyes:  Sclerae anicteric, conjunctiva pink. Ears: Normal auditory acuity Lungs: Clear throughout to auscultation; no increased WOB. Heart: Regular rate and rhythm; no M/R/G. Abdomen: Soft, non-distended.  BS present.  Non-tender. Rectal:  Will be done at the time of colonoscopy. Musculoskeletal: Symmetrical with no gross deformities  Skin: No lesions on visible extremities Extremities: No edema  Neurological: Alert oriented x 4, grossly non-focal Psychological:  Alert and cooperative. Normal mood and affect  ASSESSMENT AND PLAN: *Positive Hemosure: Last colonoscopy was in August 2012 with a 10-year recall.  Denies any overt sign of bleeding.  Hemoglobin normal at 14 g.  We will plan for repeat colonoscopy with Dr. Loletha Carrow since that is initially who reviewed the colonoscopy recall.  **The risks, benefits, and alternatives to colonoscopy were discussed with the patient and she consents to proceed.    CC:  Prince Solian, MD

## 2019-02-24 NOTE — Progress Notes (Signed)
____________________________________________________________  Attending physician addendum:  Thank you for sending this case to me. I have reviewed the entire note, and the outlined plan seems appropriate.  Correna Meacham Danis, MD  ____________________________________________________________  

## 2019-02-24 NOTE — Patient Instructions (Signed)
If you are age 71 or older, your body mass index should be between 23-30. Your Body mass index is 29.45 kg/m. If this is out of the aforementioned range listed, please consider follow up with your Primary Care Provider.  If you are age 58 or younger, your body mass index should be between 19-25. Your Body mass index is 29.45 kg/m. If this is out of the aformentioned range listed, please consider follow up with your Primary Care Provider.   You have been scheduled for a colonoscopy. Please follow written instructions given to you at your visit today.  Please pick up your prep supplies at the pharmacy within the next 1-3 days. If you use inhalers (even only as needed), please bring them with you on the day of your procedure.

## 2019-02-25 ENCOUNTER — Encounter: Payer: Self-pay | Admitting: Gastroenterology

## 2019-03-08 ENCOUNTER — Ambulatory Visit (INDEPENDENT_AMBULATORY_CARE_PROVIDER_SITE_OTHER): Payer: Medicare Other

## 2019-03-08 ENCOUNTER — Other Ambulatory Visit: Payer: Self-pay | Admitting: Gastroenterology

## 2019-03-08 DIAGNOSIS — Z1159 Encounter for screening for other viral diseases: Secondary | ICD-10-CM

## 2019-03-09 LAB — SARS CORONAVIRUS 2 (TAT 6-24 HRS): SARS Coronavirus 2: NEGATIVE

## 2019-03-10 ENCOUNTER — Other Ambulatory Visit: Payer: Self-pay

## 2019-03-10 ENCOUNTER — Encounter: Payer: Self-pay | Admitting: Gastroenterology

## 2019-03-10 ENCOUNTER — Ambulatory Visit (AMBULATORY_SURGERY_CENTER): Payer: Medicare Other | Admitting: Gastroenterology

## 2019-03-10 VITALS — BP 123/55 | HR 59 | Temp 98.7°F | Resp 16 | Ht 65.0 in | Wt 177.0 lb

## 2019-03-10 DIAGNOSIS — D120 Benign neoplasm of cecum: Secondary | ICD-10-CM

## 2019-03-10 DIAGNOSIS — D122 Benign neoplasm of ascending colon: Secondary | ICD-10-CM | POA: Diagnosis not present

## 2019-03-10 DIAGNOSIS — D123 Benign neoplasm of transverse colon: Secondary | ICD-10-CM

## 2019-03-10 DIAGNOSIS — D124 Benign neoplasm of descending colon: Secondary | ICD-10-CM | POA: Diagnosis not present

## 2019-03-10 DIAGNOSIS — R195 Other fecal abnormalities: Secondary | ICD-10-CM | POA: Diagnosis not present

## 2019-03-10 DIAGNOSIS — K648 Other hemorrhoids: Secondary | ICD-10-CM | POA: Diagnosis not present

## 2019-03-10 MED ORDER — SODIUM CHLORIDE 0.9 % IV SOLN: 500.0000 mL | Freq: Once | INTRAVENOUS | Status: DC

## 2019-03-10 NOTE — Progress Notes (Signed)
Called to room to assist during endoscopic procedure.  Patient ID and intended procedure confirmed with present staff. Received instructions for my participation in the procedure from the performing physician.  

## 2019-03-10 NOTE — Op Note (Signed)
[facility] Patient Name: Jill Mcguire Procedure Date: 03/10/2019 11:43 AM MRN: [id number] Endoscopist: Mallie Mussel L. Loletha Carrow , MD Age: 68 Referring MD:  Date of Birth: 06/01/1950 Gender: Female Account #: [id number] Procedure:                Colonoscopy Indications:              Heme positive stool (normal Hgb, no symptoms) Medicines:                Monitored Anesthesia Care Procedure:                Pre-Anesthesia Assessment:                           - Prior to the procedure, a History and Physical                            was performed, and patient medications and                            allergies were reviewed. The patient's tolerance of                            previous anesthesia was also reviewed. The risks                            and benefits of the procedure and the sedation                            options and risks were discussed with the patient.                            All questions were answered, and informed consent                            was obtained. Prior Anticoagulants: The patient has                            taken no previous anticoagulant or antiplatelet                            agents. ASA Grade Assessment: II - A patient with                            mild systemic disease. After reviewing the risks                            and benefits, the patient was deemed in                            satisfactory condition to undergo the procedure.                           After obtaining informed consent, the colonoscope  was passed under direct vision. Throughout the                            procedure, the patient's blood pressure, pulse, and                            oxygen saturations were monitored continuously. The                            Colonoscope was introduced through the anus and                            advanced to the the cecum, identified by                            appendiceal orifice  and ileocecal valve. The                            colonoscopy was performed without difficulty. The                            patient tolerated the procedure well. The quality                            of the bowel preparation was excellent. The                            ileocecal valve, appendiceal orifice, and rectum                            were photographed. The bowel preparation used was                            SUPREP. Scope In: 11:52:22 AM Scope Out: 12:19:25 PM Scope Withdrawal Time: 0 hours 24 minutes 15 seconds  Total Procedure Duration: 0 hours 27 minutes 3 seconds  Findings:                 The perianal and digital rectal examinations were                            normal.                           There was a small lipoma, in the ascending colon.                           A diminutive polyp was found in the cecum. The                            polyp was sessile. The polyp was removed with a                            cold snare. Resection and retrieval were complete.                            (  Jar 1)                           Four sessile polyps were found in the ascending                            colon. The polyps were diminutive in size. These                            polyps were removed with a cold snare. Resection                            and retrieval were complete. (Jar 1)                           Four sessile polyps were found in the transverse                            colon. The polyps were diminutive in size. These                            polyps were removed with a cold snare. Resection                            was complete, one polyp not retrieved. (Jar 2)                           Two sessile polyps were found in the descending                            colon. The polyps were diminutive in size. These                            polyps were removed with a cold snare. Resection                            and retrieval were complete.                            Internal hemorrhoids were found.                           The exam was otherwise without abnormality on                            direct and retroflexion views. Complications:            No immediate complications. Estimated Blood Loss:     Estimated blood loss was minimal. Impression:               - Small lipoma in the ascending colon.                           - One diminutive polyp in the cecum, removed with a  cold snare. Resected and retrieved.                           - Four diminutive polyps in the ascending colon,                            removed with a cold snare. Resected and retrieved.                           - Four diminutive polyps in the transverse colon,                            removed with a cold snare. Resected and retrieved.                           - Two diminutive polyps in the descending colon,                            removed with a cold snare. Resected and retrieved.                           - Internal hemorrhoids.                           - The examination was otherwise normal on direct                            and retroflexion views.                           No cause for heme positive stool - suspected false                            positive. Recommendation:           - Patient has a contact number available for                            emergencies. The signs and symptoms of potential                            delayed complications were discussed with the                            patient. Return to normal activities tomorrow.                            Written discharge instructions were provided to the                            patient.                           - Resume previous diet.                           -  Continue present medications.                           - Await pathology results.                           - Repeat colonoscopy in 1 year for surveillance. [name],  MD 03/10/2019 12:30:47 PM This report has been signed electronically.

## 2019-03-10 NOTE — Patient Instructions (Signed)
Handouts given for polyps and Hemorrhoids.  You'll need another colonoscopy in 1 year.  Await pathology.  YOU HAD AN ENDOSCOPIC PROCEDURE TODAY AT [facility]:   Refer to the procedure report that was given to you for any specific questions about what was found during the examination.  If the procedure report does not answer your questions, please call your gastroenterologist to clarify.  If you requested that your care partner not be given the details of your procedure findings, then the procedure report has been included in a sealed envelope for you to review at your convenience later.  YOU SHOULD EXPECT: Some feelings of bloating in the abdomen. Passage of more gas than usual.  Walking can help get rid of the air that was put into your GI tract during the procedure and reduce the bloating. If you had a lower endoscopy (such as a colonoscopy or flexible sigmoidoscopy) you may notice spotting of blood in your stool or on the toilet paper. If you underwent a bowel prep for your procedure, you may not have a normal bowel movement for a few days.  Please Note:  You might notice some irritation and congestion in your nose or some drainage.  This is from the oxygen used during your procedure.  There is no need for concern and it should clear up in a day or so.  SYMPTOMS TO REPORT IMMEDIATELY:   Following lower endoscopy (colonoscopy or flexible sigmoidoscopy):  Excessive amounts of blood in the stool  Significant tenderness or worsening of abdominal pains  Swelling of the abdomen that is new, acute  Fever of 100F or higher   For urgent or emergent issues, a gastroenterologist can be reached at any hour by calling [phone].   DIET:  We do recommend a small meal at first, but then you may proceed to your regular diet.  Drink plenty of fluids but you should avoid alcoholic beverages for 24 hours.  ACTIVITY:  You should plan to take it easy for the rest of today and you  should NOT DRIVE or use heavy machinery until tomorrow (because of the sedation medicines used during the test).    FOLLOW UP: Our staff will call the number listed on your records 48-72 hours following your procedure to check on you and address any questions or concerns that you may have regarding the information given to you following your procedure. If we do not reach you, we will leave a message.  We will attempt to reach you two times.  During this call, we will ask if you have developed any symptoms of COVID 19. If you develop any symptoms (ie: fever, flu-like symptoms, shortness of breath, cough etc.) before then, please call [phone].  If you test positive for Covid 19 in the 2 weeks post procedure, please call and report this information to [place].    If any biopsies were taken you will be contacted by phone or by letter within the next 1-3 weeks.  Please call us at [phone] if you have not heard about the biopsies in 3 weeks.    SIGNATURES/CONFIDENTIALITY: You and/or your care partner have signed paperwork which will be entered into your electronic medical record.  These signatures attest to the fact that that the information above on your After Visit Summary has been reviewed and is understood.  Full responsibility of the confidentiality of this discharge information lies with you and/or your care-partner.

## 2019-03-10 NOTE — Progress Notes (Signed)
Temperature taken by J.R., VS taken by D.T.

## 2019-03-10 NOTE — Progress Notes (Signed)
To PACU, VSS. Report to [email or web address] 

## 2019-03-12 ENCOUNTER — Telehealth: Payer: Self-pay

## 2019-03-12 ENCOUNTER — Telehealth: Payer: Self-pay | Admitting: *Deleted

## 2019-03-12 NOTE — Telephone Encounter (Signed)
No answer/busy on 2nd follow up call.

## 2019-03-12 NOTE — Telephone Encounter (Signed)
  Follow up Call-  Call back number 03/10/2019  Post procedure Call Back phone  # [phone]  Permission to leave phone message Yes  Some recent data might be hidden     Patient questions:  Person is not home according to man who answered.  He said to call her back later.

## 2019-03-15 ENCOUNTER — Encounter: Payer: Self-pay | Admitting: Gastroenterology

## 2019-03-15 DIAGNOSIS — D120 Benign neoplasm of cecum: Secondary | ICD-10-CM | POA: Diagnosis not present

## 2019-03-15 DIAGNOSIS — D122 Benign neoplasm of ascending colon: Secondary | ICD-10-CM

## 2019-07-30 ENCOUNTER — Other Ambulatory Visit: Payer: Self-pay | Admitting: Internal Medicine

## 2019-07-30 DIAGNOSIS — N289 Disorder of kidney and ureter, unspecified: Secondary | ICD-10-CM

## 2019-08-04 ENCOUNTER — Ambulatory Visit
Admission: RE | Admit: 2019-08-04 | Discharge: 2019-08-04 | Disposition: A | Discharge status: Home / Self Care | Payer: Medicare Other | Source: Ambulatory Visit | Attending: Internal Medicine | Admitting: Internal Medicine

## 2019-08-04 DIAGNOSIS — N289 Disorder of kidney and ureter, unspecified: Secondary | ICD-10-CM

## 2019-10-20 ENCOUNTER — Other Ambulatory Visit: Payer: Self-pay | Admitting: Internal Medicine

## 2019-10-20 DIAGNOSIS — Z1231 Encounter for screening mammogram for malignant neoplasm of breast: Secondary | ICD-10-CM

## 2019-11-15 ENCOUNTER — Other Ambulatory Visit: Payer: Self-pay

## 2019-11-15 ENCOUNTER — Ambulatory Visit
Admission: RE | Admit: 2019-11-15 | Discharge: 2019-11-15 | Disposition: A | Discharge status: Home / Self Care | Payer: Medicare Other | Source: Ambulatory Visit | Attending: Internal Medicine | Admitting: Internal Medicine

## 2019-11-15 DIAGNOSIS — Z1231 Encounter for screening mammogram for malignant neoplasm of breast: Secondary | ICD-10-CM

## 2019-12-23 ENCOUNTER — Ambulatory Visit: Payer: Medicare Other

## 2019-12-23 ENCOUNTER — Ambulatory Visit: Payer: Medicare Other | Attending: Internal Medicine

## 2019-12-23 DIAGNOSIS — Z23 Encounter for immunization: Secondary | ICD-10-CM

## 2019-12-23 NOTE — Progress Notes (Signed)
   Covid-[facility] Vaccination [facility]  Name:  ERIS HANNAN    MRN: [id number] DOB: [birth year]  12/23/2019  Ms. Kujawa was observed post Covid-19 immunization for 15 minutes without incident. She was provided with Vaccine Information Sheet and instruction to access the V-Safe system.   Ms. Fudala was instructed to call 911 with any severe reactions post vaccine: Marland Kitchen Difficulty breathing  . Swelling of face and throat  . A fast heartbeat  . A bad rash all over body  . Dizziness and weakness

## 2020-02-23 ENCOUNTER — Encounter: Payer: Self-pay | Admitting: Gastroenterology

## 2020-03-09 ENCOUNTER — Observation Stay (HOSPITAL_COMMUNITY)
Admission: EM | Admit: 2020-03-09 | Discharge: 2020-03-10 | Disposition: A | Discharge status: Home / Self Care | Payer: Medicare Other | Attending: Internal Medicine | Admitting: Internal Medicine

## 2020-03-09 ENCOUNTER — Emergency Department (HOSPITAL_COMMUNITY): Payer: Medicare Other

## 2020-03-09 ENCOUNTER — Encounter (HOSPITAL_COMMUNITY): Payer: Self-pay | Admitting: Obstetrics and Gynecology

## 2020-03-09 ENCOUNTER — Other Ambulatory Visit: Payer: Self-pay

## 2020-03-09 DIAGNOSIS — Z87891 Personal history of nicotine dependence: Secondary | ICD-10-CM | POA: Insufficient documentation

## 2020-03-09 DIAGNOSIS — R4182 Altered mental status, unspecified: Secondary | ICD-10-CM | POA: Diagnosis present

## 2020-03-09 DIAGNOSIS — Z79899 Other long term (current) drug therapy: Secondary | ICD-10-CM | POA: Insufficient documentation

## 2020-03-09 DIAGNOSIS — Z20822 Contact with and (suspected) exposure to covid-19: Secondary | ICD-10-CM | POA: Insufficient documentation

## 2020-03-09 DIAGNOSIS — R404 Transient alteration of awareness: Secondary | ICD-10-CM

## 2020-03-09 DIAGNOSIS — G454 Transient global amnesia: Secondary | ICD-10-CM | POA: Diagnosis not present

## 2020-03-09 DIAGNOSIS — N179 Acute kidney failure, unspecified: Secondary | ICD-10-CM | POA: Diagnosis not present

## 2020-03-09 DIAGNOSIS — E785 Hyperlipidemia, unspecified: Secondary | ICD-10-CM | POA: Diagnosis not present

## 2020-03-09 DIAGNOSIS — Z853 Personal history of malignant neoplasm of breast: Secondary | ICD-10-CM | POA: Insufficient documentation

## 2020-03-09 DIAGNOSIS — I10 Essential (primary) hypertension: Secondary | ICD-10-CM

## 2020-03-09 LAB — RAPID URINE DRUG SCREEN, HOSP PERFORMED
Amphetamines: NOT DETECTED
Barbiturates: NOT DETECTED
Benzodiazepines: POSITIVE — AB
Cocaine: NOT DETECTED
Opiates: NOT DETECTED
Tetrahydrocannabinol: NOT DETECTED

## 2020-03-09 LAB — COMPREHENSIVE METABOLIC PANEL
ALT: 24 U/L (ref 0–44)
AST: 23 U/L (ref 15–41)
Albumin: 4.5 g/dL (ref 3.5–5.0)
Alkaline Phosphatase: 54 U/L (ref 38–126)
Anion gap: 10 (ref 5–15)
BUN: 20 mg/dL (ref 8–23)
CO2: 26 mmol/L (ref 22–32)
Calcium: 9.2 mg/dL (ref 8.9–10.3)
Chloride: 100 mmol/L (ref 98–111)
Creatinine, Ser: 1.53 mg/dL — ABNORMAL HIGH (ref 0.44–1.00)
GFR, Estimated: 36 mL/min — ABNORMAL LOW (ref >60)
Glucose, Bld: 106 mg/dL — ABNORMAL HIGH (ref 70–99)
Potassium: 3.9 mmol/L (ref 3.5–5.1)
Sodium: 136 mmol/L (ref 135–145)
Total Bilirubin: 0.4 mg/dL (ref 0.3–1.2)
Total Protein: 7.8 g/dL (ref 6.5–8.1)

## 2020-03-09 LAB — URINALYSIS, ROUTINE W REFLEX MICROSCOPIC
Bilirubin Urine: NEGATIVE
Glucose, UA: NEGATIVE mg/dL
Hgb urine dipstick: NEGATIVE
Ketones, ur: NEGATIVE mg/dL
Nitrite: NEGATIVE
Protein, ur: NEGATIVE mg/dL
Specific Gravity, Urine: 1.015 (ref 1.005–1.030)
pH: 5 (ref 5.0–8.0)

## 2020-03-09 LAB — RESP PANEL BY RT-PCR (FLU A&B, COVID) ARPGX2
Influenza A by PCR: NEGATIVE
Influenza B by PCR: NEGATIVE
SARS Coronavirus 2 by RT PCR: NEGATIVE

## 2020-03-09 LAB — CBC
HCT: 42.7 % (ref 36.0–46.0)
Hemoglobin: 13.9 g/dL (ref 12.0–15.0)
MCH: 31.3 pg (ref 26.0–34.0)
MCHC: 32.6 g/dL (ref 30.0–36.0)
MCV: 96.2 fL (ref 80.0–100.0)
Platelets: 254 10*3/uL (ref 150–400)
RBC: 4.44 MIL/uL (ref 3.87–5.11)
RDW: 12.3 % (ref 11.5–15.5)
WBC: 5.1 10*3/uL (ref 4.0–10.5)
nRBC: 0 % (ref 0.0–0.2)

## 2020-03-09 MED ORDER — BUPROPION HCL ER (XL) 150 MG PO TB24
150.0000 mg | ORAL_TABLET | Freq: Every morning | ORAL | Status: DC
  Administered 2020-03-10: 150 mg via ORAL
  Filled 2020-03-09: qty 1

## 2020-03-09 MED ORDER — DOCUSATE SODIUM 100 MG PO CAPS: 100.0000 mg | ORAL_CAPSULE | Freq: Two times a day (BID) | ORAL | Status: DC | PRN

## 2020-03-09 MED ORDER — STROKE: EARLY STAGES OF RECOVERY BOOK
Freq: Once | Status: AC
  Filled 2020-03-09: qty 1

## 2020-03-09 MED ORDER — AMLODIPINE BESYLATE 5 MG PO TABS
10.0000 mg | ORAL_TABLET | Freq: Every morning | ORAL | Status: DC
  Administered 2020-03-10: 10 mg via ORAL
  Filled 2020-03-09: qty 2

## 2020-03-09 MED ORDER — FLUTICASONE PROPIONATE 50 MCG/ACT NA SUSP
2.0000 | Freq: Every day | NASAL | Status: DC
  Administered 2020-03-09 – 2020-03-10 (×2): 2 via NASAL
  Filled 2020-03-09 (×2): qty 16

## 2020-03-09 MED ORDER — LACTATED RINGERS IV BOLUS
500.0000 mL | Freq: Once | INTRAVENOUS | Status: AC
  Administered 2020-03-09: 500 mL via INTRAVENOUS

## 2020-03-09 MED ORDER — LORAZEPAM 2 MG/ML IJ SOLN
0.5000 mg | Freq: Once | INTRAMUSCULAR | Status: AC
  Administered 2020-03-09: 0.5 mg via INTRAVENOUS
  Filled 2020-03-09: qty 1

## 2020-03-09 MED ORDER — DULOXETINE HCL 30 MG PO CPEP
30.0000 mg | ORAL_CAPSULE | Freq: Every day | ORAL | Status: DC
  Administered 2020-03-10: 30 mg via ORAL
  Filled 2020-03-09: qty 1

## 2020-03-09 MED ORDER — PRAVASTATIN SODIUM 20 MG PO TABS
20.0000 mg | ORAL_TABLET | Freq: Every day | ORAL | Status: DC
  Administered 2020-03-10: 20 mg via ORAL
  Filled 2020-03-09: qty 1

## 2020-03-09 MED ORDER — ENOXAPARIN SODIUM 40 MG/0.4ML ~~LOC~~ SOLN
40.0000 mg | SUBCUTANEOUS | Status: DC
  Administered 2020-03-09: 40 mg via SUBCUTANEOUS
  Filled 2020-03-09: qty 0.4

## 2020-03-09 NOTE — Consult Note (Signed)
NEUROLOGY CONSULTATION NOTE   Date of service: March 09, 2020 Patient Name: Jill Mcguire MRN:  [id number] DOB:  [birth year] Reason for consult: "memory loss" _ _ _   _ __   _ __ _ _  __ __   _ __   __ _  History of Present Illness  Jill Mcguire is a 70 y.o. female with PMH significant for hx of brwast cancer s/p chemo and radiation, HTN, who had an episode of encephalopathy with grogginess on Tuesday with resolution.  Patient and son at the bedside and both equally provided history.  Patient reported some sore throat and some urinary urgency on Saturday.  She has also not been eating a whole lot over the last few days.  She had some headache over the left eye on Monday.  Patient has poor recollection of the events on Tuesday.  Son reported that he called her on Tuesday morning to talk to her noted that she was a little groggy and slow to respond.  Other family member noted the same on Tuesday afternoon.  This is very unusual for her and she has never had a known this in the past.  This resolved and now patient is back to her baseline.   No hx of EtOh use, no recreational substances. She has not been eating and drinking as much over the last few days. No N/V/D, no signs or symptoms of UTI except for urgency for the last couple of days. She also reported some sore throat to the family since Saturday.  No recent medication changes.  No prior hx of stroke, mother had strokes.  CTH without contrast with no significant abnormalities.   ROS   Constitutional Denies weight loss, fever and chills.   HEENT Denies changes in vision and hearing.   Respiratory Denies SOB and cough.   CV Denies palpitations and CP   GI Denies abdominal pain, nausea, vomiting and diarrhea.   GU Denies dysuria and urinary frequency.   MSK Denies myalgia and joint pain.   Skin Denies rash and pruritus.   Neurological Denies headache and syncope.   Psychiatric Denies recent changes in mood. Denies  anxiety and depression.   Past History   Past Medical History:  Diagnosis Date  . Allergy   . Anxiety   . Arthritis   . Breast CA ([place]) 2000   right breast  . Cataract   . Cough 05/22/14   states drainage from head- "when I lay down- drainage is clear and no fever"  . GERD (gastroesophageal reflux disease)   . Hyperlipidemia   . Hypertension   . Personal history of chemotherapy 2000   Right Breast Cancer  . Personal history of radiation therapy 2000   Right Breast Cancer  . PONV (postoperative nausea and vomiting)   . Sleep apnea    Past Surgical History:  Procedure Laterality Date  . ANTERIOR AND POSTERIOR REPAIR N/A 01/26/2013   Procedure: ANTERIOR (CYSTOCELE) AND POSTERIOR REPAIR (RECTOCELE);  Surgeon: Jonnie Kind, MD;  Location: [facility];  Service: Gynecology;  Laterality: N/A;  . anterior cervical laminectomy  1996  . APPENDECTOMY  1982  . BREAST BIOPSY Right 1994, 2000  . BREAST LUMPECTOMY Right 2001   lymph node dissection  . COLONOSCOPY    . EYE SURGERY Right    "muscle resection"/ cataract extraction with IOL  . LUMBAR DISC SURGERY  2008   L4-L5  . LUMBAR LAMINECTOMY/DECOMPRESSION MICRODISCECTOMY N/A 05/18/2014   Procedure: MICRO LUMBER  DECOMPRESSION L4-5;  Surgeon: Johnn Hai, MD;  Location: [facility];  Service: Orthopedics;  Laterality: N/A;  . OOPHORECTOMY Left 01/26/2013   Procedure: OOPHORECTOMY;  Surgeon: Jonnie Kind, MD;  Location: [facility];  Service: Gynecology;  Laterality: Left;  . right eye muscel dissection  1995  . SALPINGOOPHORECTOMY  1982   right  . VAGINAL HYSTERECTOMY N/A 01/26/2013   Procedure: HYSTERECTOMY VAGINAL;  Surgeon: Jonnie Kind, MD;  Location: [facility];  Service: Gynecology;  Laterality: N/A;   Family History  Problem Relation Age of Onset  . Diabetes Mother   . Hypertension Mother   . Stroke Mother   . Lung cancer Father   . Lung cancer Brother   . Lung cancer Brother   . Heart attack Brother   . Heart attack Brother     Social History   Socioeconomic History  . Marital status: Married    Spouse name: Not on file  . Number of children: 2  . Years of education: Not on file  . Highest education level: Not on file  Occupational History  . Occupation: retired Copywriter, advertising  Tobacco Use  . Smoking status: Former Smoker    Quit date: 10/24/2008    Years since quitting: 11.3  . Smokeless tobacco: Never Used  Vaping Use  . Vaping Use: Never used  Substance and Sexual Activity  . Alcohol use: Yes    Comment: occasional alcohol intake  . Drug use: No  . Sexual activity: Never    Birth control/protection: Post-menopausal  Other Topics Concern  . Not on file  Social History Narrative  . Not on file   Social Determinants of Health   Financial Resource Strain: Not on file  Food Insecurity: Not on file  Transportation Needs: Not on file  Physical Activity: Not on file  Stress: Not on file  Social Connections: Not on file   Allergies  Allergen Reactions  . Darvocet [Propoxyphene N-Acetaminophen] Rash  . Epinephrine     Rapid heart rate, decreased BP after local anesthetic injection to skin 30 yrs ago Tolerated Marcaine&Epi completely 01/26/13 for Ant & Post Vag repair.  . Tagamet [Cimetidine] Rash  . Tetanus Toxoids Rash    Medications  (Not in a hospital admission)    Vitals   Vitals:   03/09/20 1151 03/09/20 1153 03/09/20 1200 03/09/20 1330  BP: 134/88  (!) 142/73 139/66  Pulse: 85  80 70  Resp: 14  19 18   Temp: 98.3 F (36.8 C)     TempSrc: Oral     SpO2: 100%  100% 100%  Weight:  78.5 kg    Height:  5\' 6"  (1.676 m)       Body mass index is 27.92 kg/m.  Physical Exam   General: Laying comfortably in bed; in no acute distress. HENT: Normal oropharynx and mucosa. Normal external appearance of ears and nose. Neck: Supple, no pain or tenderness CV: No JVD. No peripheral edema. Pulmonary: Symmetric Chest rise. Normal respiratory effort. Abdomen: Soft to touch, non-tender. Ext: No  cyanosis, edema, or deformity Skin: No rash. Normal palpation of skin.  Musculoskeletal: Normal digits and nails by inspection. No clubbing.  Neurologic Examination  Mental status/Cognition: Alert, oriented to self, place, month and year, good attention. Speech/language: Fluent, comprehension intact, object naming intact, repetition intact. Cranial nerves:   CN II Pupils equal and reactive to light, no VF deficits   CN III,IV,VI EOM intact, no gaze preference or deviation, no nystagmus   CN  V normal sensation in V1, V2, and V3 segments bilaterally    CN VII no asymmetry, no nasolabial fold flattening    CN VIII normal hearing to speech    CN IX & X normal palatal elevation, no uvular deviation    CN XI 5/5 head turn and 5/5 shoulder shrug bilaterally    CN XII midline tongue protrusion    Motor:  Muscle bulk: normal, tone normal, pronator drift none tremor none Mvmt Root Nerve  Muscle Right Left Comments  SA C5/6 Ax Deltoid 5 5   EF C5/6 Mc Biceps 5 5   EE C6/7/8 Rad Triceps 5 5   WF C6/7 Med FCR 5 5   WE C7/8 PIN ECU 5 5   F Ab C8/T1 U ADM/FDI 5 5   HF L1/2/3 Fem Illopsoas 5 5   KE L2/3/4 Fem Quad 5 5   DF L4/5 D Peron Tib Ant 5 5   PF S1/2 Tibial Grc/Sol 5 5    Reflexes:  Right Left Comments  Pectoralis      Biceps (C5/6) 0 0   Brachioradialis (C5/6) 2 2    Triceps (C6/7) 0 0    Patellar (L3/4) 1 1    Achilles (S1) 0 0    Hoffman      Plantar     Jaw jerk    Sensation:  Light touch Intact throughout   Pin prick    Temperature    Vibration   Proprioception    Coordination/Complex Motor:  - Finger to Nose intact BL - Heel to shin intact BL - Rapid alternating movement are normal - Gait: Stride length short. Arm swing none. Base width narrow.  Labs   CBC:  Recent Labs  Lab 03/09/20 1200  WBC 5.1  HGB 13.9  HCT 42.7  MCV 96.2  PLT 564    Basic Metabolic Panel:  Lab Results  Component Value Date   NA 136 03/09/2020   K 3.9 03/09/2020   CO2 26  03/09/2020   GLUCOSE 106 (H) 03/09/2020   BUN 20 03/09/2020   CREATININE 1.53 (H) 03/09/2020   CALCIUM 9.2 03/09/2020   GFRNONAA 36 (L) 03/09/2020   GFRAA 73 (L) 05/12/2014   Lipid Panel: No results found for: LDLCALC HgbA1c: No results found for: HGBA1C Urine Drug Screen: No results found for: LABOPIA, COCAINSCRNUR, LABBENZ, AMPHETMU, THCU, LABBARB  Alcohol Level No results found for: [place]  CT Head without contrast: CTH was negative for a large hypodensity concerning for a large territory infarct or hyperdensity concerning for an ICH    Impression   Jill Mcguire is a 69 y.o. female with PMH significant for hx of brwast cancer s/p chemo and radiation, HTN, who had an episode of encephalopathy with grogginess on Tuesday with resolution.  The episodes seem more consistent with encephalopathy rather than a focal neurological deficit.  Given her poor p.o. intake over the last couple days, I would probably attribute that to contribute to her encephalopathy.  Her neurological examination today is normal with intact mentation, cognition, 2 of 3 word recollection at 5 minutes, no focal deficit at all.  CT head without contrast is negative.  Given low concern for stroke on my exam, I would recommend holding off on further stroke work-up at this time.  I did discuss with patient and her son that if she has another similar episode, she should be brought into the emergency department for evaluation with a MRI brain without contrast and routine  EEG.  Recommendations  -Low concern for stroke, episodes seem more consistent with global neurological dysfunction consistent with encephalopathy and is now completely resolved.  If she has a second similar episode, would recommend full work-up with MRI brain without contrast and a routine EEG at that time.  At this time we will hold off on further work-up.  -Plan discussed with patient and son and they had no further questions for me at this  time. ______________________________________________________________________   Thank you for the opportunity to take part in the care of this patient. If you have any further questions, please contact the neurology consultation attending.  Signed,  [place] Pager Number [phone] _ _ _   _ __   _ __ _ _  __ __   _ __   __ _

## 2020-03-09 NOTE — ED Triage Notes (Signed)
Patient reports to the ER for AMS. Patient was last seen normal Monday as she does not remember anything from Tuesday. Patient was BIB son. Patient is alert to month, year, self, situation, but does not remember anything from Tuesday and reports visual field changes and gait problems since yesterday.

## 2020-03-09 NOTE — ED Provider Notes (Signed)
Medical screening examination/treatment/procedure(s) were conducted as a shared visit with non-physician practitioner(s) and myself.  I personally evaluated the patient during the encounter. Briefly, the patient is a 69 y.o. female who presents to the ED after episode of altered mental status.  Normal vitals.  No fever.  Patient was last seen normal on Monday.  It appears that she does not remember much from 2 days ago.  She states that sound like her family thought she had slurred speech on Tuesday and Wednesday but today she has not noticed any slurred speech.  She had noticed some issues with walking and off balance and this all day yesterday.  No history of stroke.  She has been under a lot of stress as her husband recently died.  She does not have any weakness or numbness currently.  Neurologically she appears intact.  She is able to walk without much issues.  Possibly stroke versus stress related reaction.  We will get basic labs and head CT and discuss with neurology.  Possible that this could be a TIA.  Patient felt like she just needed to get rest in order for her to feel better.  Overall she appears well.  Will check for urine infection as well but she denies any UTI symptoms.  This chart was dictated using voice recognition software.  Despite best efforts to proofread,  errors can occur which can change the documentation meaning.     EKG Interpretation None           Lennice Sites, DO 03/09/20 1307

## 2020-03-09 NOTE — H&P (Signed)
History and Physical        Hospital Admission Note Date: 03/09/2020  Patient name: Jill Mcguire Medical record number: [id number] Date of birth: 06-Nov-1950 Age: 69 y.o. Gender: female  PCP: Prince Solian, MD  Patient coming from: Home Lives with: Alone At baseline, ambulates: Independently  Chief Complaint    Chief Complaint  Patient presents with  . Altered Mental Status      HPI:   This is a 69 year old female with a history of breast cancer, hypertension, hyperlipidemia, anxiety who presented to the ED for evaluation of an episode of altered mental status.  Patient states that she was in her usual state of health until around last week when she started having a headache on Friday above her left eye which persisted for a day or 2.  She went to lunch with friends over the weekend and had a brief episode of substernal chest pain with radiation to her jaw which lasted about 10 minutes and resolved and has not recurred.  She also had some fatigue throughout this period of time.  She then went to sleep on Monday night.  On Tuesday she spoke with her son and other family members over the phone on multiple occasions throughout the day.  Son at bedside states that she sounded groggy.  The patient does not remember any events on Tuesday.  She went to sleep Tuesday night and woke up Wednesday and has since felt near baseline but still fatigued.  She denies any known history of migraines, seizures, stroke or cardiac issues.  Of note, her husband did recently passed away in Dec 20, 2022 which has understandably put added stress on the family recently.  ED Course: Afebrile, hemodynamically stable, on room air. Notable Labs: Na 136, K 3.9, BUN 20, Cr 1.5 (baseline 0.9 in 2016), WBC 5.1. Notable Imaging: CT head unremarkable. Patient received ativan 0.5 mg x 1.  ED provider discussed with  neurology.   Vitals:   03/09/20 1200 03/09/20 1330  BP: (!) 142/73 139/66  Pulse: 80 70  Resp: 19 18  Temp:    SpO2: 100% 100%     Review of Systems:  Review of Systems  All other systems reviewed and are negative.   Medical/Social/Family History   Past Medical History: Past Medical History:  Diagnosis Date  . Allergy   . Anxiety   . Arthritis   . Breast CA ([place]) 2000   right breast  . Cataract   . Cough 05/22/14   states drainage from head- "when I lay down- drainage is clear and no fever"  . GERD (gastroesophageal reflux disease)   . Hyperlipidemia   . Hypertension   . Personal history of chemotherapy 2000   Right Breast Cancer  . Personal history of radiation therapy 2000   Right Breast Cancer  . PONV (postoperative nausea and vomiting)   . Sleep apnea     Past Surgical History:  Procedure Laterality Date  . ANTERIOR AND POSTERIOR REPAIR N/A 01/26/2013   Procedure: ANTERIOR (CYSTOCELE) AND POSTERIOR REPAIR (RECTOCELE);  Surgeon: Jonnie Kind, MD;  Location: [facility];  Service: Gynecology;  Laterality: N/A;  . anterior cervical laminectomy  1996  . APPENDECTOMY  [place] Right 1994, 2000  . BREAST LUMPECTOMY Right 2001   lymph node dissection  . COLONOSCOPY    . EYE SURGERY Right    "muscle resection"/ cataract extraction with IOL  . LUMBAR DISC SURGERY  2008   L4-L5  . LUMBAR LAMINECTOMY/DECOMPRESSION MICRODISCECTOMY N/A 05/18/2014   Procedure: MICRO LUMBER DECOMPRESSION L4-5;  Surgeon: Johnn Hai, MD;  Location: [facility];  Service: Orthopedics;  Laterality: N/A;  . OOPHORECTOMY Left 01/26/2013   Procedure: OOPHORECTOMY;  Surgeon: Jonnie Kind, MD;  Location: [facility];  Service: Gynecology;  Laterality: Left;  . right eye muscel dissection  1995  . SALPINGOOPHORECTOMY  1982   right  . VAGINAL HYSTERECTOMY N/A 01/26/2013   Procedure: HYSTERECTOMY VAGINAL;  Surgeon: Jonnie Kind, MD;  Location: [facility];  Service: Gynecology;  Laterality:  N/A;    Medications: Prior to Admission medications   Medication Sig Start Date End Date Taking? Authorizing Provider  acetaminophen (TYLENOL) 500 MG tablet Take 1,000 mg by mouth every 6 (six) hours as needed for mild pain.   Yes [provider]  amLODipine (NORVASC) 10 MG tablet Take 10 mg by mouth every morning.   Yes [provider]  Ascorbic Acid (VITAMIN C) 1000 MG tablet Take 1,000 mg by mouth daily.   Yes [provider]  Biotin 1000 MCG tablet Take 1,000 mcg by mouth daily.   Yes [provider]  buPROPion (WELLBUTRIN XL) 150 MG 24 hr tablet Take 150 mg by mouth every morning. 12/28/18  Yes [provider]  docusate sodium (COLACE) 100 MG capsule Take 1 capsule (100 mg total) by mouth 2 (two) times daily as needed for mild constipation. 05/18/14  Yes Susa Day, MD  DULoxetine (CYMBALTA) 30 MG capsule Take 30 mg by mouth daily. 12/15/19  Yes [provider]  fluticasone (FLONASE) 50 MCG/ACT nasal spray Place 2 sprays into both nostrils daily. 02/01/19  Yes [provider]  polyethylene glycol powder (GLYCOLAX/MIRALAX) 17 GM/SCOOP powder Take 1 Container by mouth daily as needed.   Yes [provider]  pravastatin (PRAVACHOL) 20 MG tablet Take 20 mg by mouth daily.   Yes [provider]    Allergies:   Allergies  Allergen Reactions  . Darvocet [Propoxyphene N-Acetaminophen] Rash  . Epinephrine     Rapid heart rate, decreased BP after local anesthetic injection to skin 30 yrs ago Tolerated Marcaine&Epi completely 01/26/13 for Ant & Post Vag repair.  . Tagamet [Cimetidine] Rash  . Tetanus Toxoids Rash    Social History:  reports that she quit smoking about 11 years ago. She has never used smokeless tobacco. She reports current alcohol use. She reports that she does not use drugs.  Family History: Family History  Problem Relation Age of Onset  . Diabetes Mother   . Hypertension Mother   . Stroke  Mother   . Lung cancer Father   . Lung cancer Brother   . Lung cancer Brother   . Heart attack Brother   . Heart attack Brother      Objective   Physical Exam: Blood pressure 139/66, pulse 70, temperature 98.3 F (36.8 C), temperature source Oral, resp. rate 18, height 5\' 6"  (1.676 m), weight 78.5 kg, SpO2 100 %.  Physical Exam Vitals and nursing note reviewed.  Constitutional:      Appearance: Normal appearance.  HENT:     Head: Normocephalic and atraumatic.  Eyes:     Conjunctiva/sclera: Conjunctivae normal.  Cardiovascular:     Rate and Rhythm: Normal rate and regular rhythm.  Pulmonary:     Effort: Pulmonary effort is normal.     Breath sounds: Normal breath sounds.  Abdominal:     General: Abdomen is flat.     Palpations: Abdomen is soft.  Musculoskeletal:        General: No swelling or tenderness.  Skin:    Coloration: Skin is not jaundiced or pale.  Neurological:     General: No focal deficit present.     Mental Status: She is alert and oriented to person, place, and time. Mental status is at baseline.     Cranial Nerves: No cranial nerve deficit.     Motor: No weakness.  Psychiatric:        Mood and Affect: Mood normal.        Behavior: Behavior normal.     LABS on Admission: I have personally reviewed all the labs and imaging below    Basic Metabolic Panel: Recent Labs  Lab 03/09/20 1200  NA 136  K 3.9  CL 100  CO2 26  GLUCOSE 106*  BUN 20  CREATININE 1.53*  CALCIUM 9.2   Liver Function Tests: Recent Labs  Lab 03/09/20 1200  AST 23  ALT 24  ALKPHOS 54  BILITOT 0.4  PROT 7.8  ALBUMIN 4.5   No results for input(s): LIPASE, AMYLASE in the last 168 hours. No results for input(s): AMMONIA in the last 168 hours. CBC: Recent Labs  Lab 03/09/20 1200  WBC 5.1  HGB 13.9  HCT 42.7  MCV 96.2  PLT 254   Cardiac Enzymes: No results for input(s): CKTOTAL, CKMB, CKMBINDEX, TROPONINI in the last 168 hours. BNP: Invalid input(s):  POCBNP CBG: No results for input(s): GLUCAP in the last 168 hours.  Radiological Exams on Admission:  CT Head Wo Contrast  Result Date: 03/09/2020 CLINICAL DATA:  Altered mental status EXAM: CT HEAD WITHOUT CONTRAST TECHNIQUE: Contiguous axial images were obtained from the base of the skull through the vertex without intravenous contrast. COMPARISON:  None. FINDINGS: Brain: No evidence of acute infarction, hemorrhage, hydrocephalus, extra-axial collection or mass lesion/mass effect. 1.1 cm focus of low-density in the right cerebellar hemisphere likely remote lacunar infarct. Vascular: No hyperdense vessel or unexpected calcification. Skull: Normal. Negative for fracture or focal lesion. Sinuses/Orbits: No acute finding. Other: None. IMPRESSION: No acute intracranial findings. Electronically Signed   By: Davina Poke D.O.   On: 03/09/2020 13:11      EKG: Independently reviewed.  Left axis, sinus rhythm with slightly shortened PR interval   A & P   Principal Problem:   AMS (altered mental status) Active Problems:   Essential hypertension   Hyperlipidemia   AKI (acute kidney injury) ([place])   1. Episode of altered mental status, concern for transient global amnesia versus TIA versus complex migraine, resolved a. CT head unremarkable b. Discussed with neurology over the phone who will evaluate the patient at bedside.  Appreciate recommendations c. Echo d. HbA1c e. Lipid panel f. Telemetry  2. Substernal chest pain a. 1 episode during lunch with radiation to her jaw which lasted 10 minutes b. Telemetry c. Echo d. We will check troponin e. Consider outpatient stress test  3. AKI a. Cr 1.53, baseline was 0.9 in 2016 b. Likely from poor PO intake for the past few days c. IV fluids and encourage PO intake  4. Hypertension a. Continue amlodipine  5. Hyperlipidemia a. Continue statin b. Check Lipid  panel   DVT prophylaxis: lovenox   Code Status: Prior  Diet: pending  swallow screen Family Communication: Admission, patients condition and plan of care including tests being ordered have been discussed with the patient who indicates understanding and agrees with the plan and Code Status. Patient's son was updated  Disposition Plan: The appropriate patient status for this patient is OBSERVATION. Observation status is judged to be reasonable and necessary in order to provide the required intensity of service to ensure the patient's safety. The patient's presenting symptoms, physical exam findings, and initial radiographic and laboratory data in the context of their medical condition is felt to place them at decreased risk for further clinical deterioration. Furthermore, it is anticipated that the patient will be medically stable for discharge from the hospital within 2 midnights of admission. The following factors support the patient status of observation.   " The patient's presenting symptoms include episode of memory loss. " The physical exam findings include unremarkable. " The initial radiographic and laboratory data are positive for AKI.    Status is: Observation  The patient remains OBS appropriate and will d/c before 2 midnights.  Dispo: The patient is from: Home              Anticipated d/c is to: Home              Anticipated d/c date is: 1 day              Patient currently is not medically stable to d/c.    Consultants  . neurology  Procedures  . none  Time Spent on Admission: 65 minutes    Harold Hedge, DO [facility]  03/09/2020, 3:27 PM

## 2020-03-09 NOTE — ED Provider Notes (Signed)
[facility] DEPT Provider Note   CSN: [id number] Arrival date & time: 03/09/20  1144     History Chief Complaint  Patient presents with  . Altered Mental Status    Jill Mcguire is a 69 y.o. female.  HPI Patient is a 69 year old female with a medical history as noted below.  She presents today due to altered mental status.  Patient was last seen normal 3 days ago.  She went to bed that evening and woke up on Wednesday "and thought it was Tuesday".  She states she has no recollection of Tuesday or the events that happened throughout the day.  Her son told her that yesterday he felt that her gait was somewhat abnormal and she was experiencing some slurred speech.  This has since alleviated.  She comes in today for evaluation.  She states she feels very fatigued.  Otherwise, she only complains of urinary urgency.  No dysuria or hematuria.  No nausea, vomiting, chest pain, shortness of breath, abdominal pain.    Past Medical History:  Diagnosis Date  . Allergy   . Anxiety   . Arthritis   . Breast CA ([place]) 2000   right breast  . Cataract   . Cough 05/22/14   states drainage from head- "when I lay down- drainage is clear and no fever"  . GERD (gastroesophageal reflux disease)   . Hyperlipidemia   . Hypertension   . Personal history of chemotherapy 2000   Right Breast Cancer  . Personal history of radiation therapy 2000   Right Breast Cancer  . PONV (postoperative nausea and vomiting)   . Sleep apnea     Patient Active Problem List   Diagnosis Date Noted  . Heme positive stool 02/24/2019  . Screening for viral disease 02/24/2019  . Postoperative breast asymmetry 06/10/2017  . Pain of left scapula 08/09/2016  . Screening mammogram, encounter for 08/05/2016  . Spinal stenosis of lumbar region 05/18/2014  . Spinal stenosis at L4-L5 level 05/18/2014  . Malignant neoplasm of overlapping sites of right breast in female, estrogen receptor  positive ([place]) 07/02/2012    Past Surgical History:  Procedure Laterality Date  . ANTERIOR AND POSTERIOR REPAIR N/A 01/26/2013   Procedure: ANTERIOR (CYSTOCELE) AND POSTERIOR REPAIR (RECTOCELE);  Surgeon: Jonnie Kind, MD;  Location: [facility];  Service: Gynecology;  Laterality: N/A;  . anterior cervical laminectomy  1996  . APPENDECTOMY  1982  . BREAST BIOPSY Right 1994, 2000  . BREAST LUMPECTOMY Right 2001   lymph node dissection  . COLONOSCOPY    . EYE SURGERY Right    "muscle resection"/ cataract extraction with IOL  . LUMBAR DISC SURGERY  2008   L4-L5  . LUMBAR LAMINECTOMY/DECOMPRESSION MICRODISCECTOMY N/A 05/18/2014   Procedure: MICRO LUMBER DECOMPRESSION L4-5;  Surgeon: Johnn Hai, MD;  Location: [facility];  Service: Orthopedics;  Laterality: N/A;  . OOPHORECTOMY Left 01/26/2013   Procedure: OOPHORECTOMY;  Surgeon: Jonnie Kind, MD;  Location: [facility];  Service: Gynecology;  Laterality: Left;  . right eye muscel dissection  1995  . SALPINGOOPHORECTOMY  1982   right  . VAGINAL HYSTERECTOMY N/A 01/26/2013   Procedure: HYSTERECTOMY VAGINAL;  Surgeon: Jonnie Kind, MD;  Location: [facility];  Service: Gynecology;  Laterality: N/A;     OB History   No obstetric history on file.     Family History  Problem Relation Age of Onset  . Diabetes Mother   . Hypertension Mother   .  Stroke Mother   . Lung cancer Father   . Lung cancer Brother   . Lung cancer Brother   . Heart attack Brother   . Heart attack Brother     Social History   Tobacco Use  . Smoking status: Former Smoker    Quit date: 10/24/2008    Years since quitting: 11.3  . Smokeless tobacco: Never Used  Vaping Use  . Vaping Use: Never used  Substance Use Topics  . Alcohol use: Yes    Comment: occasional alcohol intake  . Drug use: No    Home Medications Prior to Admission medications   Medication Sig Start Date End Date Taking? Authorizing Provider  acetaminophen (TYLENOL) 500 MG tablet Take 100 mg by  mouth every 6 (six) hours as needed for mild pain.    [provider]  amLODipine (NORVASC) 10 MG tablet Take 10 mg by mouth every morning.     [provider]  buPROPion (WELLBUTRIN XL) 150 MG 24 hr tablet Take 150 mg by mouth every morning. 12/28/18   [provider]  docusate sodium (COLACE) 100 MG capsule Take 1 capsule (100 mg total) by mouth 2 (two) times daily as needed for mild constipation. 05/18/14   Susa Day, MD  fluticasone (FLONASE) 50 MCG/ACT nasal spray Place 2 sprays into both nostrils daily. 02/01/19   [provider]  pravastatin (PRAVACHOL) 20 MG tablet Take 20 mg by mouth daily.    [provider]    Allergies    Darvocet [propoxyphene n-acetaminophen], Epinephrine, Tagamet [cimetidine], and Tetanus toxoids  Review of Systems   Review of Systems  All other systems reviewed and are negative. Ten systems reviewed and are negative for acute change, except as noted in the HPI.    Physical Exam Updated Vital Signs BP (!) 142/73   Pulse 80   Temp 98.3 F (36.8 C) (Oral)   Resp 19   Ht 5\' 6"  (1.676 m)   Wt 78.5 kg   SpO2 100%   BMI 27.92 kg/m   Physical Exam Vitals and nursing note reviewed.  Constitutional:      General: She is not in acute distress.    Appearance: Normal appearance. She is normal weight. She is not ill-appearing, toxic-appearing or diaphoretic.  HENT:     Head: Normocephalic and atraumatic.     Right Ear: External ear normal.     Left Ear: External ear normal.     Nose: Nose normal.     Mouth/Throat:     Mouth: Mucous membranes are moist.     Pharynx: Oropharynx is clear. No oropharyngeal exudate or posterior oropharyngeal erythema.  Eyes:     Extraocular Movements: Extraocular movements intact.  Cardiovascular:     Rate and Rhythm: Normal rate and regular rhythm.     Pulses: Normal pulses.     Heart sounds: Normal heart sounds. No murmur heard. No friction rub. No gallop.   Pulmonary:      Effort: Pulmonary effort is normal. No respiratory distress.     Breath sounds: Normal breath sounds. No stridor. No wheezing, rhonchi or rales.  Abdominal:     General: Abdomen is flat.     Tenderness: There is no abdominal tenderness.  Musculoskeletal:        General: Normal range of motion.     Cervical back: Normal range of motion and neck supple. No tenderness.  Skin:    General: Skin is warm and dry.  Neurological:  General: No focal deficit present.     Mental Status: She is alert and oriented to person, place, and time.     Comments: Patient is oriented to person, place, time.  She speaks clearly, coherently, and in complete sentences.  No facial droop.  Strength is 5 out of 5 in all 4 extremities.  No gross deficits noted.  Extraocular movements intact.  Visual fields intact.  Psychiatric:        Mood and Affect: Mood normal.        Behavior: Behavior normal.     ED Results / Procedures / Treatments   Labs (all labs ordered are listed, but only abnormal results are displayed) Labs Reviewed  COMPREHENSIVE METABOLIC PANEL - Abnormal; Notable for the following components:      Result Value   Glucose, Bld 106 (*)    Creatinine, Ser 1.53 (*)    GFR, Estimated 36 (*)    All other components within normal limits  URINALYSIS, ROUTINE W REFLEX MICROSCOPIC - Abnormal; Notable for the following components:   Leukocytes,Ua TRACE (*)    Bacteria, UA RARE (*)    All other components within normal limits  RAPID URINE DRUG SCREEN, HOSP PERFORMED - Abnormal; Notable for the following components:   Benzodiazepines POSITIVE (*)    All other components within normal limits  RESP PANEL BY RT-PCR (FLU A&B, COVID) [id number]  URINE CULTURE  CBC  CBG MONITORING, ED   EKG None  Radiology CT Head Wo Contrast  Result Date: 03/09/2020 CLINICAL DATA:  Altered mental status EXAM: CT HEAD WITHOUT CONTRAST TECHNIQUE: Contiguous axial images were obtained from the base of the skull through  the vertex without intravenous contrast. COMPARISON:  None. FINDINGS: Brain: No evidence of acute infarction, hemorrhage, hydrocephalus, extra-axial collection or mass lesion/mass effect. 1.1 cm focus of low-density in the right cerebellar hemisphere likely remote lacunar infarct. Vascular: No hyperdense vessel or unexpected calcification. Skull: Normal. Negative for fracture or focal lesion. Sinuses/Orbits: No acute finding. Other: None. IMPRESSION: No acute intracranial findings. Electronically Signed   By: Davina Poke D.O.   On: 03/09/2020 13:11   Procedures Procedures (including critical care time)  Medications Ordered in ED Medications  LORazepam (ATIVAN) injection 0.5 mg (0.5 mg Intravenous Given 03/09/20 1256)  lactated ringers bolus 500 mL (500 mLs Intravenous New Bag/Given 03/09/20 1538)    ED Course  I have reviewed the triage vital signs and the nursing notes.  Pertinent labs & imaging results that were available during my care of the patient were reviewed by me and considered in my medical decision making (see chart for details).  Clinical Course as of 03/09/20 1655  Thu Mar 09, 2020  1405 Patient was discussed with neurology.  Feel the patients sx be consistent with transient global amnesia versus TIA.  CT does show a possible lacunar infarct.  Neurology ordered MRI of the brain as well as EEG.  Recommended admission and transfer to [facility] for further work-up and management. [LJ]    Clinical Course User Index [LJ] Moody Bruins   MDM Rules/Calculators/A&P                          Patient is a 69 year old female who presents to the emergency department due to confusion.  Patient states that she was feeling normal 3 days ago and then woke up on Wednesday morning believing that it was Tuesday morning.  She has no recollection of the events  that occurred on Tuesday.  Patient states that her son was concerned that she was having difficulty with her gait yesterday  as well as was expressing slurred speech.  I followed up with the patient's son who states that patient just seemed confused and fatigued.  He states that she normally never has these symptoms.  He does note that his father passed away in Jan 13, 2023 and is concerned that may be stress/depression could be a factor in her symptoms this week.  Patient's basic labs are reassuring.  I obtained a CT scan of the head which shows a 1.1 cm focus of low density in the right cerebellar hemisphere likely a remote lacunar infarct.  I discussed with neurology who recommended admission for a TIA work-up.  Patient to receive an MRI as well as an EEG.  They recommended admission and transfer to [facility].  Patient was discussed with the hospitalist team.  They will accept patient into their care at this time.  Respiratory panel has been ordered.  Final Clinical Impression(s) / ED Diagnoses Final diagnoses:  Transient alteration of awareness    Rx / DC Orders ED Discharge Orders    None       Rayna Sexton, PA-C 03/09/20 [place], [place], DO 03/10/20 [phone]

## 2020-03-10 ENCOUNTER — Observation Stay (HOSPITAL_BASED_OUTPATIENT_CLINIC_OR_DEPARTMENT_OTHER): Payer: Medicare Other

## 2020-03-10 DIAGNOSIS — R079 Chest pain, unspecified: Secondary | ICD-10-CM | POA: Diagnosis not present

## 2020-03-10 DIAGNOSIS — I10 Essential (primary) hypertension: Secondary | ICD-10-CM

## 2020-03-10 DIAGNOSIS — E65 Localized adiposity: Secondary | ICD-10-CM | POA: Diagnosis not present

## 2020-03-10 DIAGNOSIS — E785 Hyperlipidemia, unspecified: Secondary | ICD-10-CM | POA: Diagnosis not present

## 2020-03-10 DIAGNOSIS — N179 Acute kidney failure, unspecified: Secondary | ICD-10-CM

## 2020-03-10 LAB — BASIC METABOLIC PANEL
Anion gap: 13 (ref 5–15)
BUN: 16 mg/dL (ref 8–23)
CO2: 25 mmol/L (ref 22–32)
Calcium: 9.2 mg/dL (ref 8.9–10.3)
Chloride: 100 mmol/L (ref 98–111)
Creatinine, Ser: 0.99 mg/dL (ref 0.44–1.00)
GFR, Estimated: >60 mL/min (ref >60)
Glucose, Bld: 106 mg/dL — ABNORMAL HIGH (ref 70–99)
Potassium: 4 mmol/L (ref 3.5–5.1)
Sodium: 138 mmol/L (ref 135–145)

## 2020-03-10 LAB — ECHOCARDIOGRAM COMPLETE
Area-P 1/2: 2.34 cm2
Height: 66 in
S' Lateral: 2.4 cm
Weight: 2768 oz

## 2020-03-10 LAB — LIPID PANEL
Cholesterol: 162 mg/dL (ref 0–200)
HDL: 47 mg/dL (ref >40)
LDL Cholesterol: 86 mg/dL (ref 0–99)
Total CHOL/HDL Ratio: 3.4 RATIO
Triglycerides: 146 mg/dL (ref <150)
VLDL: 29 mg/dL (ref 0–40)

## 2020-03-10 LAB — URINE CULTURE: Culture: <10000 — AB

## 2020-03-10 LAB — CBC
HCT: 40.7 % (ref 36.0–46.0)
Hemoglobin: 13.3 g/dL (ref 12.0–15.0)
MCH: 31.2 pg (ref 26.0–34.0)
MCHC: 32.7 g/dL (ref 30.0–36.0)
MCV: 95.5 fL (ref 80.0–100.0)
Platelets: 222 10*3/uL (ref 150–400)
RBC: 4.26 MIL/uL (ref 3.87–5.11)
RDW: 12.3 % (ref 11.5–15.5)
WBC: 4.4 10*3/uL (ref 4.0–10.5)
nRBC: 0 % (ref 0.0–0.2)

## 2020-03-10 LAB — HEMOGLOBIN A1C
Hgb A1c MFr Bld: 6.3 % — ABNORMAL HIGH (ref 4.8–5.6)
Mean Plasma Glucose: 134.11 mg/dL

## 2020-03-10 NOTE — Discharge Summary (Signed)
Physician Discharge Summary  Jill Mcguire DPO:[id number] DOB: [birth year] DOA: 03/09/2020  PCP: Prince Solian, MD  Admit date: 03/09/2020 Discharge date: 03/10/2020  Admitted From: Home  Discharge disposition: Home  Recommendations for Outpatient Follow-Up:   . Follow up with your primary care provider in one week.  . Check CBC, BMP, magnesium in the next visit  Discharge Diagnosis:   Principal Problem:   AMS (altered mental status) Active Problems:   Essential hypertension   Hyperlipidemia   AKI (acute kidney injury) ([place])   Discharge Condition: Improved.  Diet recommendation: Low sodium, heart healthy.    Wound care: None.  Code status: Full.   History of Present Illness:   This is a 69 year old female with a history of breast cancer, hypertension, hyperlipidemia, anxiety who presented to the ED for evaluation of an episode of altered mental status.  Patient states that she was in her usual state of health until around last week when she started having a headache on Friday above her left eye which persisted for a day or 2.  She went to lunch with friends over the weekend and had a brief episode of substernal chest pain with radiation to her jaw which lasted about 10 minutes and resolved and has not recurred.  She also had some fatigue throughout this period of time.  She then went to sleep on Monday night.  On Tuesday she spoke with her son and other family members over the phone on multiple occasions throughout the day.  Son at bedside states that she sounded groggy.  The patient does not remember any events on Tuesday.  She went to sleep Tuesday night and woke up Wednesday and has since felt near baseline but still fatigued.   Of note, her husband did recently passed away in 12-19-22 which has understandably put added stress on the family recently.  In the ED patient was noted to have stable vitals.  Creatinine was elevated at 1.5 from baseline 0.9.  CT head was  unremarkable.  Neurology was consulted from the ED and patient was admitted to the hospital.   Hospital Course:   Following conditions were addressed during hospitalization as listed below,   transient global amnesia,  CT head scan was unremarkable.  Neurology does not recommend further work-up.  At this time patient seems to be at her baseline.  Patient was volume depleted on presentation.  This could have contributed to her altered mental status.  Substernal chest pain During eating.  Likely GERD.  2D echocardiogram was performed which showed LV ejection fraction of 60 to 65%.  No regional wall motion abnormalities.  Acute coronary syndrome has been ruled out.  AKI Likely secondary to volume depletion. Creatinine of 1.53 on presentation., baseline was 0.9 in 2016. Likely from poor PO intake for the past few days.  Improved with IV hydration.  Patient was encouraged oral hydration on discharge.  Essential hypertension Continue amlodipine  Hyperlipidemia Continue pravastatin on discharge.  Disposition.  At this time, patient is stable for disposition home.  Spoke with the patient's family at bedside.  Will need to follow-up with her primary care physician as outpatient.  Medical Consultants:    Neurology  Procedures:    2D echocardiogram, CT head scan Subjective:   Today, patient was seen and examined at bedside.  No headache nausea vomiting dizziness lightheadedness.  Discharge Exam:   Vitals:   03/10/20 0213 03/10/20 0608  BP: 131/69 (!) 120/58  Pulse: 73 66  Resp: 18 16  Temp: 97.9 F (36.6 C) 98.9 F (37.2 C)  SpO2: 98% 99%   Vitals:   03/09/20 1800 03/09/20 2151 03/10/20 0213 03/10/20 0608  BP: 135/72 126/65 131/69 (!) 120/58  Pulse: 69 74 73 66  Resp: 19 18 18 16   Temp: (!) 100.9 F (38.3 C) 98 F (36.7 C) 97.9 F (36.6 C) 98.9 F (37.2 C)  TempSrc: Oral Oral Oral Oral  SpO2: 100% 92% 98% 99%  Weight:      Height:       General: Alert awake,  not in obvious distress HENT: pupils equally reacting to light,  No scleral pallor or icterus noted. Oral mucosa is moist.  Chest:  Clear breath sounds.  Diminished breath sounds bilaterally. No crackles or wheezes.  CVS: S1 &S2 heard. No murmur.  Regular rate and rhythm. Abdomen: Soft, nontender, nondistended.  Bowel sounds are heard.   Extremities: No cyanosis, clubbing or edema.  Peripheral pulses are palpable. Psych: Alert, awake and oriented, normal mood CNS:  No cranial nerve deficits.  Power equal in all extremities.   Skin: Warm and dry.  No rashes noted.  The results of significant diagnostics from this hospitalization (including imaging, microbiology, ancillary and laboratory) are listed below for reference.     Diagnostic Studies:   CT Head Wo Contrast  Result Date: 03/09/2020 CLINICAL DATA:  Altered mental status EXAM: CT HEAD WITHOUT CONTRAST TECHNIQUE: Contiguous axial images were obtained from the base of the skull through the vertex without intravenous contrast. COMPARISON:  None. FINDINGS: Brain: No evidence of acute infarction, hemorrhage, hydrocephalus, extra-axial collection or mass lesion/mass effect. 1.1 cm focus of low-density in the right cerebellar hemisphere likely remote lacunar infarct. Vascular: No hyperdense vessel or unexpected calcification. Skull: Normal. Negative for fracture or focal lesion. Sinuses/Orbits: No acute finding. Other: None. IMPRESSION: No acute intracranial findings. Electronically Signed   By: Davina Poke D.O.   On: 03/09/2020 13:11     Labs:   Basic Metabolic Panel: Recent Labs  Lab 03/09/20 1200 03/10/20 0550  NA 136 138  K 3.9 4.0  CL 100 100  CO2 26 25  GLUCOSE 106* 106*  BUN 20 16  CREATININE 1.53* 0.99  CALCIUM 9.2 9.2   GFR Estimated Creatinine Clearance: 55.1 mL/min (by C-G formula based on SCr of 0.99 mg/dL). Liver Function Tests: Recent Labs  Lab 03/09/20 1200  AST 23  ALT 24  ALKPHOS 54  BILITOT 0.4   PROT 7.8  ALBUMIN 4.5   No results for input(s): LIPASE, AMYLASE in the last 168 hours. No results for input(s): AMMONIA in the last 168 hours. Coagulation profile No results for input(s): INR, PROTIME in the last 168 hours.  CBC: Recent Labs  Lab 03/09/20 1200 03/10/20 0550  WBC 5.1 4.4  HGB 13.9 13.3  HCT 42.7 40.7  MCV 96.2 95.5  PLT 254 222   Cardiac Enzymes: No results for input(s): CKTOTAL, CKMB, CKMBINDEX, TROPONINI in the last 168 hours. BNP: Invalid input(s): POCBNP CBG: No results for input(s): GLUCAP in the last 168 hours. D-Dimer No results for input(s): DDIMER in the last 72 hours. Hgb A1c Recent Labs    03/10/20 0550  HGBA1C 6.3*   Lipid Profile Recent Labs    03/10/20 0550  CHOL 162  HDL 47  LDLCALC 86  TRIG 146  CHOLHDL 3.4   Thyroid function studies No results for input(s): TSH, T4TOTAL, T3FREE, THYROIDAB in the last 72 hours.  Invalid input(s): FREET3 Anemia work  up No results for input(s): VITAMINB12, FOLATE, FERRITIN, TIBC, IRON, RETICCTPCT in the last 72 hours. Microbiology Recent Results (from the past 240 hour(s))  Resp Panel by RT-PCR (Flu A&B, Covid) Nasopharyngeal Swab     Status: None   Collection Time: 03/09/20  2:40 PM   Specimen: Nasopharyngeal Swab; Nasopharyngeal(NP) swabs in vial transport medium  Result Value Ref Range Status   SARS Coronavirus 2 by RT PCR NEGATIVE NEGATIVE Final    Comment: (NOTE) SARS-CoV-2 target nucleic acids are NOT DETECTED.  The SARS-CoV-2 RNA is generally detectable in upper respiratory specimens during the acute phase of infection. The lowest concentration of SARS-CoV-2 viral copies this assay can detect is 138 copies/mL. A negative result does not preclude SARS-Cov-2 infection and should not be used as the sole basis for treatment or other patient management decisions. A negative result may occur with  improper specimen collection/handling, submission of specimen other than nasopharyngeal  swab, presence of viral mutation(s) within the areas targeted by this assay, and inadequate number of viral copies(<138 copies/mL). A negative result must be combined with clinical observations, patient history, and epidemiological information. The expected result is Negative.  Fact Sheet for Patients:  EntrepreneurPulse.com.au  Fact Sheet for Healthcare Providers:  IncredibleEmployment.be  This test is no t yet approved or cleared by the [place] FDA and  has been authorized for detection and/or diagnosis of SARS-CoV-2 by FDA under an Emergency Use Authorization (EUA). This EUA will remain  in effect (meaning this test can be used) for the duration of the COVID-19 declaration under Section 564(b)(1) of the Act, 21 U.S.C.section 360bbb-3(b)(1), unless the authorization is terminated  or revoked sooner.       Influenza A by PCR NEGATIVE NEGATIVE Final   Influenza B by PCR NEGATIVE NEGATIVE Final    Comment: (NOTE) The Xpert Xpress SARS-CoV-2/FLU/RSV plus assay is intended as an aid in the diagnosis of influenza from Nasopharyngeal swab specimens and should not be used as a sole basis for treatment. Nasal washings and aspirates are unacceptable for Xpert Xpress SARS-CoV-2/FLU/RSV testing.  Fact Sheet for Patients: EntrepreneurPulse.com.au  Fact Sheet for Healthcare Providers: IncredibleEmployment.be  This test is not yet approved or cleared by the [place] FDA and has been authorized for detection and/or diagnosis of SARS-CoV-2 by FDA under an Emergency Use Authorization (EUA). This EUA will remain in effect (meaning this test can be used) for the duration of the COVID-19 declaration under Section 564(b)(1) of the Act, 21 U.S.C. section 360bbb-3(b)(1), unless the authorization is terminated or revoked.  Performed at [facility], [place] [address] [place], [place] [address]       Discharge Instructions:   Discharge Instructions    Diet - low sodium heart healthy   Complete by: As directed    Discharge instructions   Complete by: As directed    Follow-up with your primary care provider in 1 week.  Increase fluid intake.  Check your blood work in the next visit   Increase activity slowly   Complete by: As directed      Allergies as of 03/10/2020      Reactions   Darvocet [propoxyphene N-acetaminophen] Rash   Epinephrine    Rapid heart rate, decreased BP after local anesthetic injection to skin 30 yrs ago Tolerated Marcaine&Epi completely 01/26/13 for Ant & Post Vag repair.   Tagamet [cimetidine] Rash   Tetanus Toxoids Rash      Medication List    TAKE these medications   acetaminophen  500 MG tablet Commonly known as: TYLENOL Take 1,000 mg by mouth every 6 (six) hours as needed for mild pain.   amLODipine 10 MG tablet Commonly known as: NORVASC Take 10 mg by mouth every morning.   Biotin 1000 MCG tablet Take 1,000 mcg by mouth daily.   buPROPion 150 MG 24 hr tablet Commonly known as: WELLBUTRIN XL Take 150 mg by mouth every morning.   docusate sodium 100 MG capsule Commonly known as: Colace Take 1 capsule (100 mg total) by mouth 2 (two) times daily as needed for mild constipation.   DULoxetine 30 MG capsule Commonly known as: CYMBALTA Take 30 mg by mouth daily.   fluticasone 50 MCG/ACT nasal spray Commonly known as: FLONASE Place 2 sprays into both nostrils daily.   polyethylene glycol powder 17 GM/SCOOP powder Commonly known as: GLYCOLAX/MIRALAX Take 1 Container by mouth daily as needed.   pravastatin 20 MG tablet Commonly known as: PRAVACHOL Take 20 mg by mouth daily.   vitamin C 1000 MG tablet Take 1,000 mg by mouth daily.       Follow-up Information    Avva, Ravisankar, MD. Schedule an appointment as soon as possible for a visit in 1 week(s).   Specialty: Internal Medicine Why: Check blood work at that time. Contact  information: [address] [place] [address] [phone]                Time coordinating discharge: 39 minutes  Signed:  Labrian Torregrossa  [facility] 03/10/2020, 1:23 PM

## 2020-03-10 NOTE — Evaluation (Signed)
Occupational Therapy Evaluation Patient Details Name: Jill Mcguire MRN: [id number] DOB: 04/18/1950 Today's Date: 03/10/2020    History of Present Illness Patient is a 69 year old female admitted 12/16 with altered mental status and substernal chest pain. CT negative. PMH includes breast CA s/p chemo + radiation, cataract   Clinical Impression   Patient lives home alone, independent at baseline. No acute OT needs identified patient at her baseline and I with all self care tasks assessed. Patient reports feeling back to her normal mentation. Will sign off at this time, please re-consult if new needs arise.    Follow Up Recommendations  No OT follow up    Equipment Recommendations  None recommended by OT       Precautions / Restrictions Precautions Precautions: None Restrictions Weight Bearing Restrictions: No      Mobility Bed Mobility Overal bed mobility: Modified Independent                  Transfers Overall transfer level: Independent                    Balance Overall balance assessment: No apparent balance deficits (not formally assessed)                                         ADL either performed or assessed with clinical judgement   ADL Overall ADL's : Independent;At baseline                                       General ADL Comments: patient able to ambulate to bathroom, perform toilet transfer + peri care and sink side grooming/hygiene including oral care without any physical assistance                  Pertinent Vitals/Pain Pain Assessment: No/denies pain     Hand Dominance Right   Extremity/Trunk Assessment Upper Extremity Assessment Upper Extremity Assessment: Overall WFL for tasks assessed   Lower Extremity Assessment Lower Extremity Assessment: Defer to PT evaluation       Communication Communication Communication: No difficulties   Cognition Arousal/Alertness:  Awake/alert Behavior During Therapy: WFL for tasks assessed/performed Overall Cognitive Status: Within Functional Limits for tasks assessed                                                Home Living Family/patient expects to be discharged to:: Private residence Living Arrangements: Alone   Type of Home: House Home Access: Stairs to enter [employer] of Steps: 1-2   Home Layout: Laundry or work area in basement     [employer] Shower/Tub: Hospital doctor Toilet: Handicapped height     Home Equipment: [place] - single point;Shower seat;Grab bars - tub/shower          Prior Functioning/Environment Level of Independence: Independent                 OT Problem List: Decreased activity tolerance         OT Goals(Current goals can be found in the care plan section) Acute Rehab OT Goals Patient Stated Goal: home OT Goal Formulation: All assessment and education complete,  DC therapy   AM-PAC OT "6 Clicks" Daily Activity     Outcome Measure Help from another person eating meals?: None Help from another person taking care of personal grooming?: None Help from another person toileting, which includes using toliet, bedpan, or urinal?: None Help from another person bathing (including washing, rinsing, drying)?: None Help from another person to put on and taking off regular upper body clothing?: None Help from another person to put on and taking off regular lower body clothing?: None 6 Click Score: 24   End of Session Nurse Communication: Mobility status  Activity Tolerance: Patient tolerated treatment well Patient left: in chair;with call bell/phone within reach;with family/visitor present  OT Visit Diagnosis: Other abnormalities of gait and mobility (R26.89)                Time: 3494-9447 OT Time Calculation (min): 18 min Charges:  OT General Charges $OT Visit: 1 Visit OT Evaluation $OT Eval Low Complexity: 1 Low  Delbert Phenix  OT OT pager: [place] 03/10/2020, 12:09 PM

## 2020-03-10 NOTE — Progress Notes (Signed)
Pt to be discharged to home this afternoon. RN reviewed discharge instructions. Medications and the schedules for these Medications reviewed with the Pt. Pt verbalized understanding of all discharge teaching, AVS with the Pt at time of discharge

## 2020-03-10 NOTE — Progress Notes (Signed)
Echocardiogram 2D Echocardiogram has been performed.  [place] Deputy Rahmah Mccamy 03/10/2020, 11:21 AM

## 2020-03-10 NOTE — TOC Initial Note (Signed)
Transition of Care [facility]) - Initial/Assessment Note    Patient Details  Name: Jill Mcguire MRN: [id number] Date of Birth: [birth year]  [facility]) CM/SW Contact:    Dessa Phi, RN Phone Number: 03/10/2020, 10:36 AM  Clinical Narrative: Spoke to son Jill Mcguire-patient from home,alone;indep ADL's. PT ordered await recc.                  Expected Discharge Plan: Home/Self Care Barriers to Discharge: Continued Medical Work up   Patient Goals and CMS Choice Patient states their goals for this hospitalization and ongoing recovery are:: go home      Expected Discharge Plan and Services Expected Discharge Plan: Home/Self Care   Discharge Planning Services: CM Consult   Living arrangements for the past 2 months: Single Family Home                                      Prior Living Arrangements/Services Living arrangements for the past 2 months: Single Family Home Lives with:: Self Patient language and need for interpreter reviewed:: Yes Do you feel safe going back to the place where you live?: Yes      Need for Family Participation in Patient Care: No (Comment) Care giver support system in place?: Yes (comment)   Criminal Activity/Legal Involvement Pertinent to Current Situation/Hospitalization: No - Comment as needed  Activities of Daily Living Home Assistive Devices/Equipment: Eyeglasses,Blood pressure cuff,Scales ADL Screening (condition at time of admission) Patient's cognitive ability adequate to safely complete daily activities?: Yes Is the patient deaf or have difficulty hearing?: No Does the patient have difficulty seeing, even when wearing glasses/contacts?: No Does the patient have difficulty concentrating, remembering, or making decisions?: Yes Patient able to express need for assistance with ADLs?: Yes Does the patient have difficulty dressing or bathing?: No Independently performs ADLs?: Yes (appropriate for developmental age) Does the  patient have difficulty walking or climbing stairs?: No Weakness of Legs: None Weakness of Arms/Hands: None  Permission Sought/Granted Permission sought to share information with : Case Manager Permission granted to share information with : Yes, Verbal Permission Granted  Share Information with NAME: Case Manager        Permission granted to share info w Contact Information: Ricarda Atayde son [phone]  Emotional Assessment Appearance:: Appears stated age Attitude/Demeanor/Rapport: Gracious Affect (typically observed): Accepting Orientation: : Oriented to Self Alcohol / Substance Use: Not Applicable Psych Involvement: No (comment)  Admission diagnosis:  Transient alteration of awareness [R40.4] AMS (altered mental status) [R41.82] Patient Active Problem List   Diagnosis Date Noted  . AMS (altered mental status) 03/09/2020  . Essential hypertension 03/09/2020  . Hyperlipidemia 03/09/2020  . AKI (acute kidney injury) ([place]) 03/09/2020  . Heme positive stool 02/24/2019  . Screening for viral disease 02/24/2019  . Postoperative breast asymmetry 06/10/2017  . Pain of left scapula 08/09/2016  . Screening mammogram, encounter for 08/05/2016  . Spinal stenosis of lumbar region 05/18/2014  . Spinal stenosis at L4-L5 level 05/18/2014  . Malignant neoplasm of overlapping sites of right breast in female, estrogen receptor positive ([place]) 07/02/2012   PCP:  Prince Solian, MD Pharmacy:   [id number] - [place], [address] [place] [address] Phone: [phone] Fax: [phone]     Social Determinants of Health (SDOH) Interventions    Readmission Risk Interventions No flowsheet data found.

## 2020-03-10 NOTE — Care Management Obs Status (Signed)
[facility] NOTIFICATION   Patient Details  Name: Jill Mcguire MRN: [id number] Date of Birth: 1950-10-25   Medicare Observation Status Notification Given:  Yes    MahabirJuliann Pulse, RN 03/10/2020, 10:34 AM

## 2020-04-04 ENCOUNTER — Other Ambulatory Visit: Payer: Self-pay

## 2020-04-04 ENCOUNTER — Ambulatory Visit (AMBULATORY_SURGERY_CENTER): Payer: Self-pay | Admitting: *Deleted

## 2020-04-04 VITALS — Ht 66.0 in | Wt 174.0 lb

## 2020-04-04 DIAGNOSIS — Z86010 Personal history of colonic polyps: Secondary | ICD-10-CM

## 2020-04-04 MED ORDER — NA SULFATE-K SULFATE-MG SULF 17.5-3.13-1.6 GM/177ML PO SOLN: 1.0000 | Freq: Once | ORAL | 0 refills | Status: AC

## 2020-04-04 NOTE — Progress Notes (Signed)

## 2020-04-05 ENCOUNTER — Encounter: Payer: Self-pay | Admitting: Gastroenterology

## 2020-04-21 ENCOUNTER — Ambulatory Visit (AMBULATORY_SURGERY_CENTER): Payer: Medicare Other | Admitting: Gastroenterology

## 2020-04-21 ENCOUNTER — Other Ambulatory Visit: Payer: Self-pay

## 2020-04-21 ENCOUNTER — Encounter: Payer: Self-pay | Admitting: Gastroenterology

## 2020-04-21 VITALS — BP 134/56 | HR 58 | Temp 97.7°F | Resp 18 | Ht 65.0 in | Wt 174.0 lb

## 2020-04-21 DIAGNOSIS — D122 Benign neoplasm of ascending colon: Secondary | ICD-10-CM | POA: Diagnosis not present

## 2020-04-21 DIAGNOSIS — D123 Benign neoplasm of transverse colon: Secondary | ICD-10-CM | POA: Diagnosis not present

## 2020-04-21 DIAGNOSIS — Z86010 Personal history of colonic polyps: Secondary | ICD-10-CM

## 2020-04-21 MED ORDER — SODIUM CHLORIDE 0.9 % IV SOLN: 500.0000 mL | Freq: Once | INTRAVENOUS | Status: DC

## 2020-04-21 NOTE — Progress Notes (Signed)
Called to room to assist during endoscopic procedure.  Patient ID and intended procedure confirmed with present staff. Received instructions for my participation in the procedure from the performing physician.  

## 2020-04-21 NOTE — Progress Notes (Signed)
pt tolerated well. VSS. awake and to recovery. Report given to RN.  

## 2020-04-21 NOTE — Patient Instructions (Signed)
? ? ?HANDOUTS ON POLYPS ,DIVERTICULOSIS,& HEMORRHOIDS GIVEN TO YOU TODAY  ? ?AWAIT PATHOLOGY RESULTS ON POLYPS REMOVED  ? ? ?YOU HAD AN ENDOSCOPIC PROCEDURE TODAY AT THE [facility]:   Refer to the procedure report that was given to you for any specific questions about what was found during the examination.  If the procedure report does not answer your questions, please call your gastroenterologist to clarify.  If you requested that your care partner not be given the details of your procedure findings, then the procedure report has been included in a sealed envelope for you to review at your convenience later. ? ?YOU SHOULD EXPECT: Some feelings of bloating in the abdomen. Passage of more gas than usual.  Walking can help get rid of the air that was put into your GI tract during the procedure and reduce the bloating. If you had a lower endoscopy (such as a colonoscopy or flexible sigmoidoscopy) you may notice spotting of blood in your stool or on the toilet paper. If you underwent a bowel prep for your procedure, you may not have a normal bowel movement for a few days. ? ?Please Note:  You might notice some irritation and congestion in your nose or some drainage.  This is from the oxygen used during your procedure.  There is no need for concern and it should clear up in a day or so. ? ?SYMPTOMS TO REPORT IMMEDIATELY: ? ?Following lower endoscopy (colonoscopy or flexible sigmoidoscopy): ? Excessive amounts of blood in the stool ? Significant tenderness or worsening of abdominal pains ? Swelling of the abdomen that is new, acute ? Fever of 100?F or higher ? ? ?For urgent or emergent issues, a gastroenterologist can be reached at any hour by calling [phone]. ?Do not use MyChart messaging for urgent concerns.  ? ? ?DIET:  We do recommend a small meal at first, but then you may proceed to your regular diet.  Drink plenty of fluids but you should avoid alcoholic beverages for 24 hours. ? ?ACTIVITY:   You should plan to take it easy for the rest of today and you should NOT DRIVE or use heavy machinery until tomorrow (because of the sedation medicines used during the test).   ? ?FOLLOW UP: ?Our staff will call the number listed on your records 48-72 hours following your procedure to check on you and address any questions or concerns that you may have regarding the information given to you following your procedure. If we do not reach you, we will leave a message.  We will attempt to reach you two times.  During this call, we will ask if you have developed any symptoms of COVID 19. If you develop any symptoms (ie: fever, flu-like symptoms, shortness of breath, cough etc.) before then, please call [phone].  If you test positive for Covid 19 in the 2 weeks post procedure, please call and report this information to us.   ? ?If any biopsies were taken you will be contacted by phone or by letter within the next 1-3 weeks.  Please call us at [phone] if you have not heard about the biopsies in 3 weeks.  ? ? ?SIGNATURES/CONFIDENTIALITY: ?You and/or your care partner have signed paperwork which will be entered into your electronic medical record.  These signatures attest to the fact that that the information above on your After Visit Summary has been reviewed and is understood.  Full responsibility of the confidentiality of this discharge information lies with you   and/or your care-partner.  ? ? ? ?

## 2020-04-21 NOTE — Op Note (Signed)
[place] Patient Name: Jill Mcguire Procedure Date: 04/21/2020 10:20 AM MRN: [id number] Endoscopist: Mallie Mussel L. Loletha Carrow , MD Age: 70 Referring MD:  Date of Birth: 09-02-1950 Gender: Female Account #: [id number] Procedure:                Colonoscopy Indications:              Surveillance: History of numerous (> 10) adenomas                            on last colonoscopy (< 3 yrs - 02/2019) Medicines:                Monitored Anesthesia Care Procedure:                Pre-Anesthesia Assessment:                           - Prior to the procedure, a History and Physical                            was performed, and patient medications and                            allergies were reviewed. The patient's tolerance of                            previous anesthesia was also reviewed. The risks                            and benefits of the procedure and the sedation                            options and risks were discussed with the patient.                            All questions were answered, and informed consent                            was obtained. Prior Anticoagulants: The patient has                            taken no previous anticoagulant or antiplatelet                            agents except for aspirin. ASA Grade Assessment:                            III - A patient with severe systemic disease. After                            reviewing the risks and benefits, the patient was                            deemed in satisfactory condition to undergo the  procedure.                           After obtaining informed consent, the colonoscope                            was passed under direct vision. Throughout the                            procedure, the patient's blood pressure, pulse, and                            oxygen saturations were monitored continuously. The                            Olympus [id number]-[id number]) Colonoscope  was                            introduced through the anus and advanced to the the                            cecum, identified by appendiceal orifice and                            ileocecal valve. The colonoscopy was performed                            without difficulty. The patient tolerated the                            procedure well. The quality of the bowel                            preparation was good. The ileocecal valve,                            appendiceal orifice, and rectum were photographed. Scope In: 10:43:36 AM Scope Out: 11:03:17 AM Scope Withdrawal Time: 0 hours 16 minutes 13 seconds  Total Procedure Duration: 0 hours 19 minutes 41 seconds  Findings:                 The perianal and digital rectal examinations were                            normal.                           There was a medium-sized lipoma, in the ascending                            colon.                           A diminutive polyp was found in the mid ascending  colon. The polyp was flat. The polyp was removed                            with a cold biopsy forceps. Resection and retrieval                            were complete.                           A 4 mm polyp was found in the proximal transverse                            colon. The polyp was semi-sessile. The polyp was                            removed with a cold snare. Resection and retrieval                            were complete.                           A few small-mouthed diverticula were found in the                            left colon.                           Internal hemorrhoids were found. The hemorrhoids                            were medium-sized.                           The exam was otherwise without abnormality on                            direct and retroflexion views. Complications:            No immediate complications. Estimated Blood Loss:     Estimated blood loss was  minimal. Impression:               - Medium-sized lipoma in the ascending colon.                           - One diminutive polyp in the mid ascending colon,                            removed with a cold biopsy forceps. Resected and                            retrieved.                           - One 4 mm polyp in the proximal transverse colon,  removed with a cold snare. Resected and retrieved.                           - Diverticulosis in the left colon.                           - Internal hemorrhoids.                           - The examination was otherwise normal on direct                            and retroflexion views. Recommendation:           - Patient has a contact number available for                            emergencies. The signs and symptoms of potential                            delayed complications were discussed with the                            patient. Return to normal activities tomorrow.                            Written discharge instructions were provided to the                            patient.                           - Resume previous diet.                           - Continue present medications.                           - Await pathology results.                           - Repeat colonoscopy is recommended for                            surveillance. The colonoscopy date will be                            determined after pathology results from today's                            exam become available for review. Cerise Lieber L. Loletha Carrow, MD 04/21/2020 11:08:11 AM This report has been signed electronically.

## 2020-04-21 NOTE — Progress Notes (Signed)
Medical history reviewed with no changes since PV. VS assessed by C.W 

## 2020-04-25 ENCOUNTER — Telehealth: Payer: Self-pay | Admitting: *Deleted

## 2020-04-25 NOTE — Telephone Encounter (Signed)
  Follow up Call-  Call back number 04/21/2020 03/10/2019  Post procedure Call Back phone  # [phone]  Permission to leave phone message Yes Yes  Some recent data might be hidden     Patient questions:  Someone picked up.  Person did not speak. Unable to leave a message.

## 2020-04-25 NOTE — Telephone Encounter (Signed)
  Follow up Call-  Call back number 04/21/2020 03/10/2019  Post procedure Call Back phone  # [phone]  Permission to leave phone message Yes Yes  Some recent data might be hidden     Patient questions:  Do you have a fever, pain , or abdominal swelling? No. Pain Score  0 *  Have you tolerated food without any problems? Yes.    Have you been able to return to your normal activities? Yes.    Do you have any questions about your discharge instructions: Diet   No. Medications  No. Follow up visit  No.  Do you have questions or concerns about your Care? No.  Actions: * If pain score is 4 or above: No action needed, pain <4.  1. Have you developed a fever since your procedure? no  2.   Have you had an respiratory symptoms (SOB or cough) since your procedure? no  3.   Have you tested positive for COVID 19 since your procedure no  4.   Have you had any family members/close contacts diagnosed with the COVID 19 since your procedure?  no   If yes to any of these questions please route to Joylene John, RN and Joella Prince, RN

## 2020-04-28 ENCOUNTER — Encounter: Payer: Self-pay | Admitting: Gastroenterology

## 2020-10-16 ENCOUNTER — Other Ambulatory Visit: Payer: Self-pay | Admitting: Internal Medicine

## 2020-10-16 DIAGNOSIS — Z1231 Encounter for screening mammogram for malignant neoplasm of breast: Secondary | ICD-10-CM

## 2020-12-06 ENCOUNTER — Ambulatory Visit
Admission: RE | Admit: 2020-12-06 | Discharge: 2020-12-06 | Disposition: A | Discharge status: Home / Self Care | Payer: Medicare Other | Source: Ambulatory Visit | Attending: Internal Medicine | Admitting: Internal Medicine

## 2020-12-06 ENCOUNTER — Other Ambulatory Visit: Payer: Self-pay

## 2020-12-06 DIAGNOSIS — Z1231 Encounter for screening mammogram for malignant neoplasm of breast: Secondary | ICD-10-CM

## 2021-11-05 ENCOUNTER — Other Ambulatory Visit: Payer: Self-pay | Admitting: Internal Medicine

## 2021-11-05 DIAGNOSIS — Z1231 Encounter for screening mammogram for malignant neoplasm of breast: Secondary | ICD-10-CM

## 2021-12-07 ENCOUNTER — Ambulatory Visit: Payer: Medicare Other

## 2021-12-10 ENCOUNTER — Ambulatory Visit
Admission: RE | Admit: 2021-12-10 | Discharge: 2021-12-10 | Disposition: A | Discharge status: Home / Self Care | Payer: Medicare Other | Source: Ambulatory Visit | Attending: Internal Medicine | Admitting: Internal Medicine

## 2021-12-10 DIAGNOSIS — Z1231 Encounter for screening mammogram for malignant neoplasm of breast: Secondary | ICD-10-CM

## 2022-11-11 ENCOUNTER — Other Ambulatory Visit: Payer: Self-pay | Admitting: Internal Medicine

## 2022-11-11 DIAGNOSIS — Z1231 Encounter for screening mammogram for malignant neoplasm of breast: Secondary | ICD-10-CM

## 2022-12-12 ENCOUNTER — Ambulatory Visit
Admission: RE | Admit: 2022-12-12 | Discharge: 2022-12-12 | Disposition: A | Discharge status: Home / Self Care | Payer: Medicare Other | Source: Ambulatory Visit | Attending: Internal Medicine | Admitting: Internal Medicine

## 2022-12-12 DIAGNOSIS — Z1231 Encounter for screening mammogram for malignant neoplasm of breast: Secondary | ICD-10-CM

## 2023-02-14 IMAGING — MG MM DIGITAL SCREENING BILAT W/ TOMO AND CAD
6 of 10 series · 6 of 30 positions shown · non-contrast
Comparison: Previous exam(s).

CLINICAL DATA: Screening.

EXAM:
DIGITAL SCREENING BILATERAL MAMMOGRAM WITH TOMOSYNTHESIS AND CAD
TECHNIQUE: Bilateral screening digital craniocaudal and mediolateral oblique
mammograms were obtained. Bilateral screening digital breast
tomosynthesis was performed. The images were evaluated with
computer-aided detection.

[L CC synth-2D]
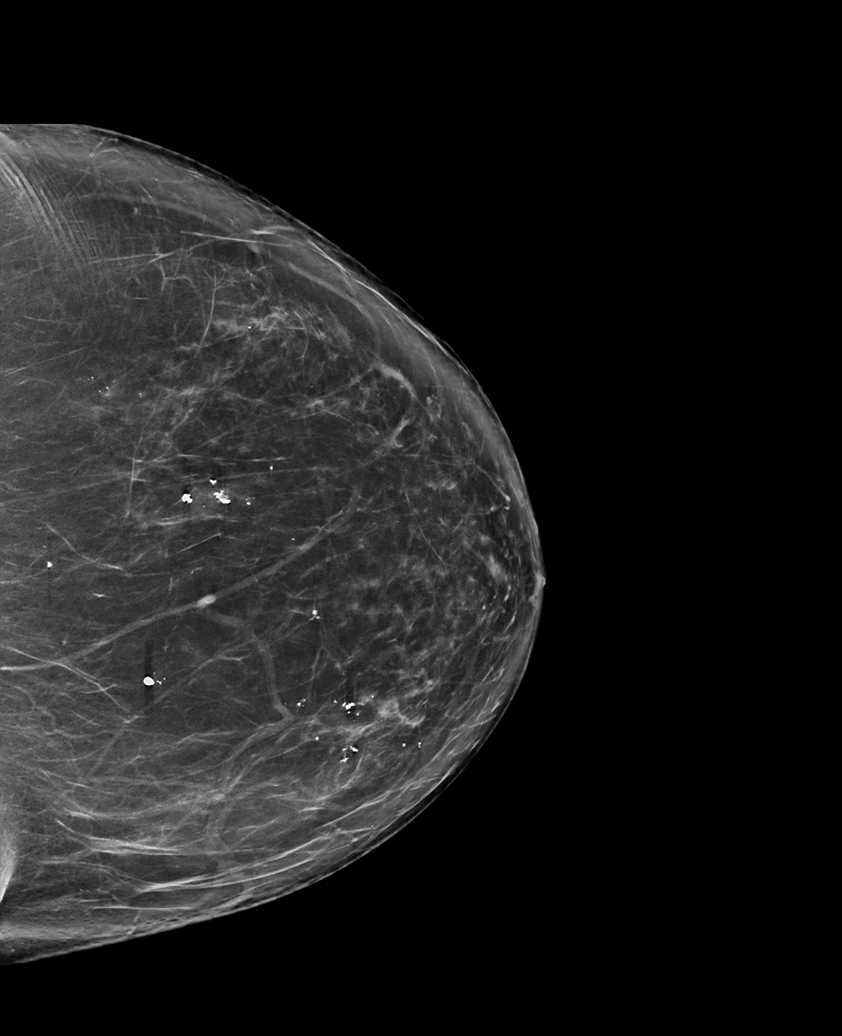

[L MLO synth-2D]
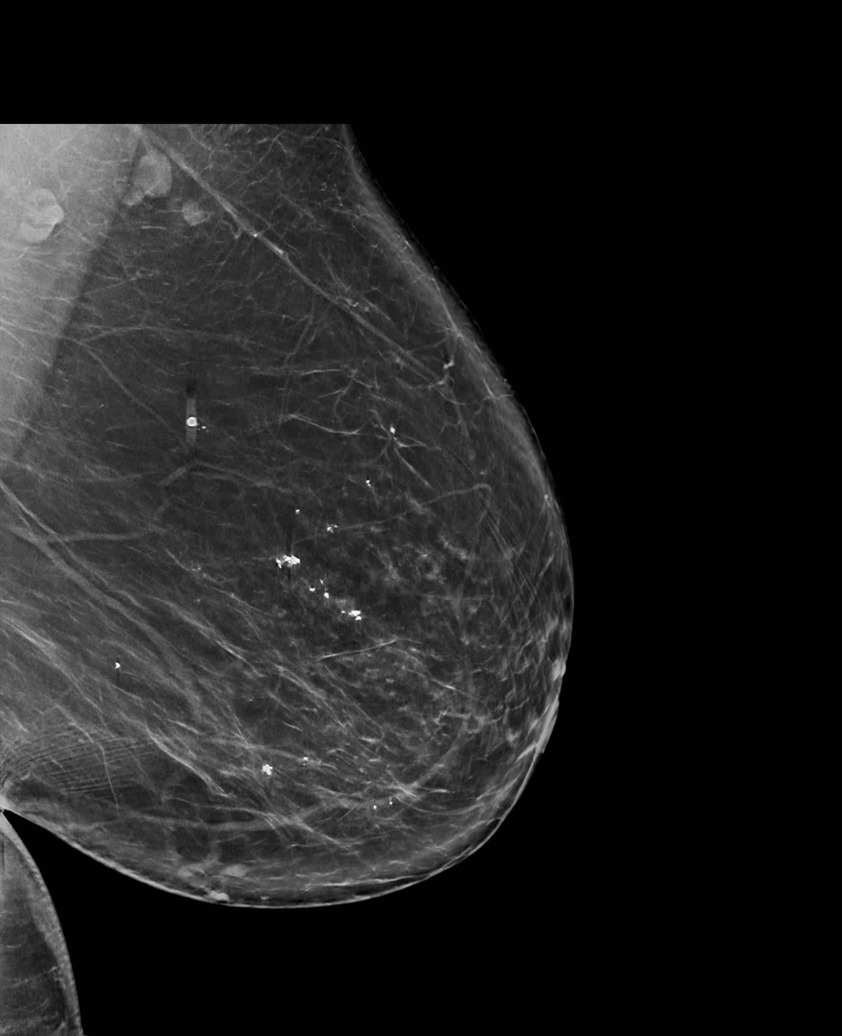

[R CC synth-2D (1 of 2)]
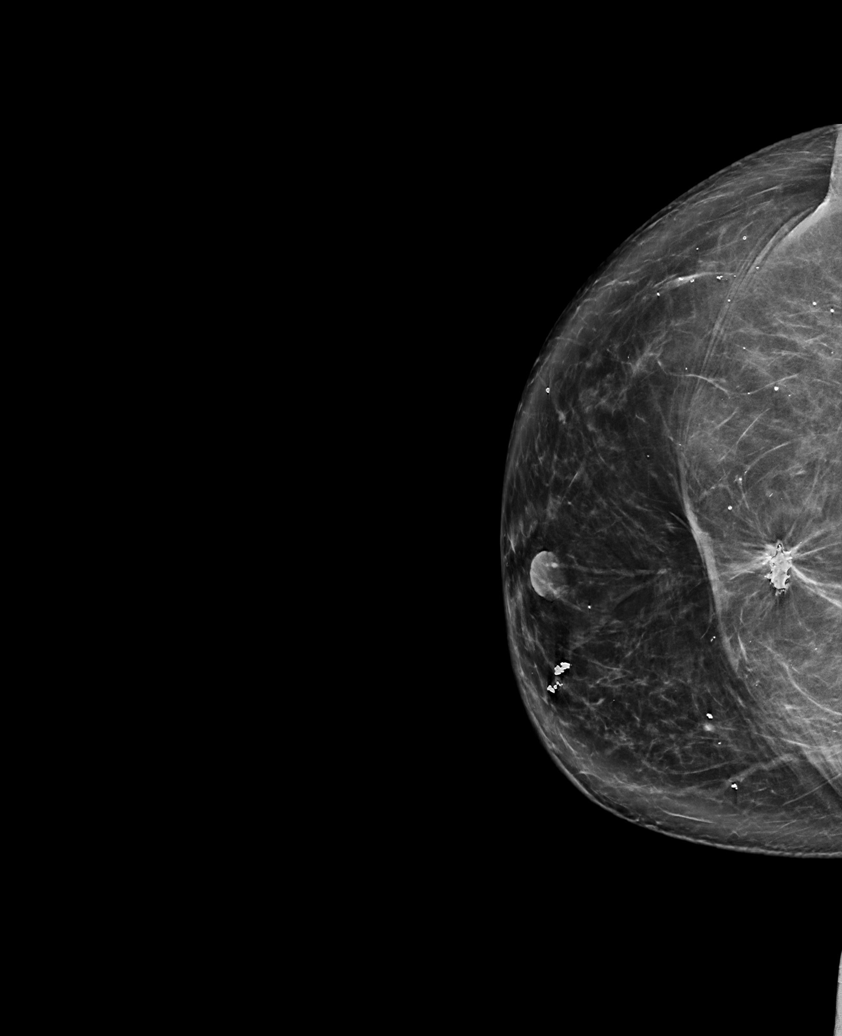

[R MLO synth-2D]
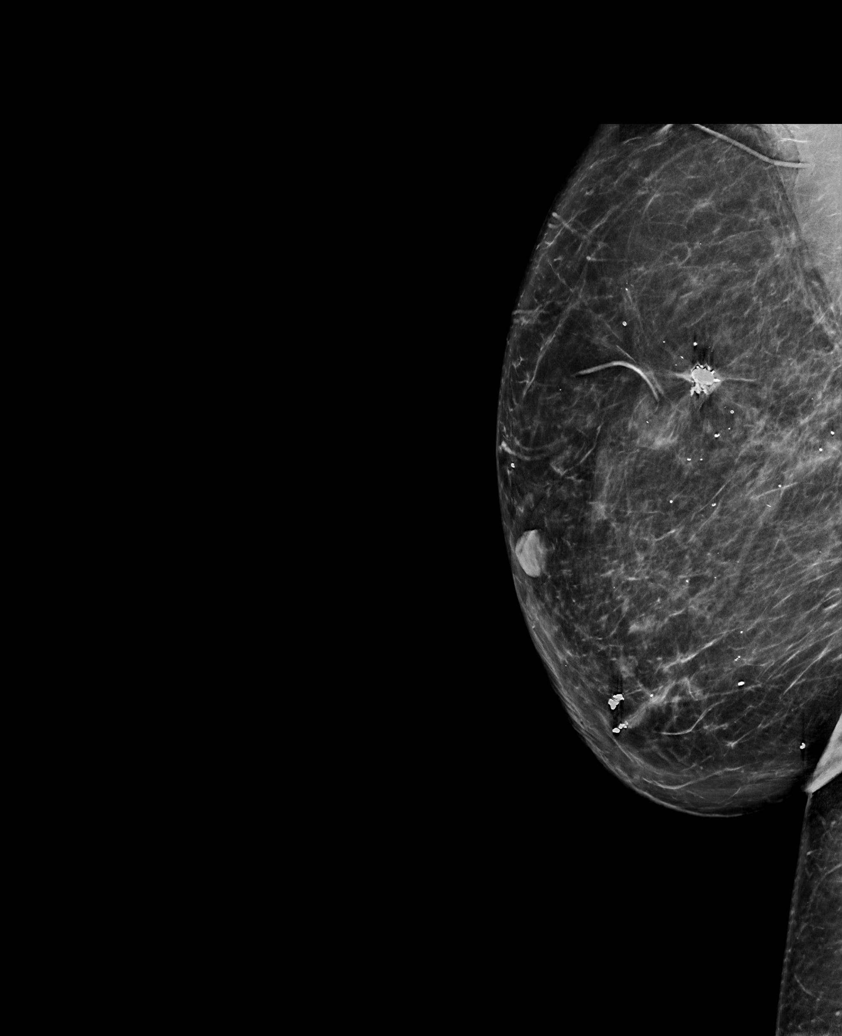

[R CC synth-2D (2 of 2)]
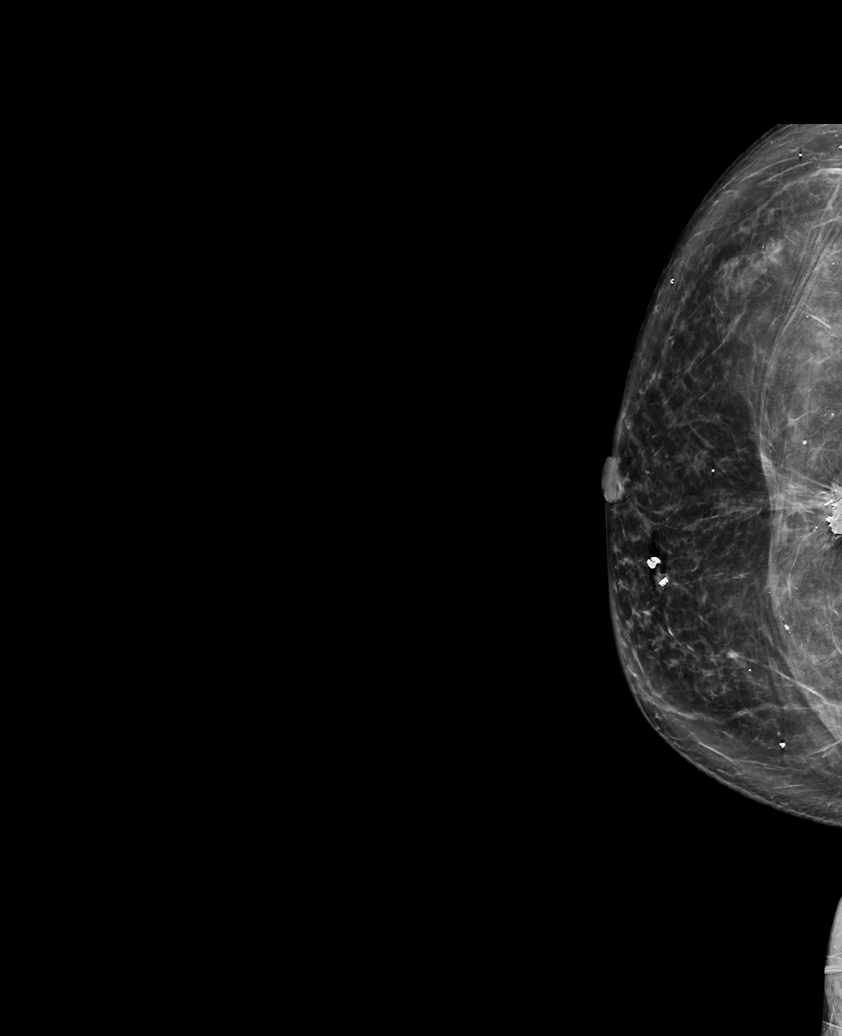

[R MLO tomo · tomo slice 43/84.0]
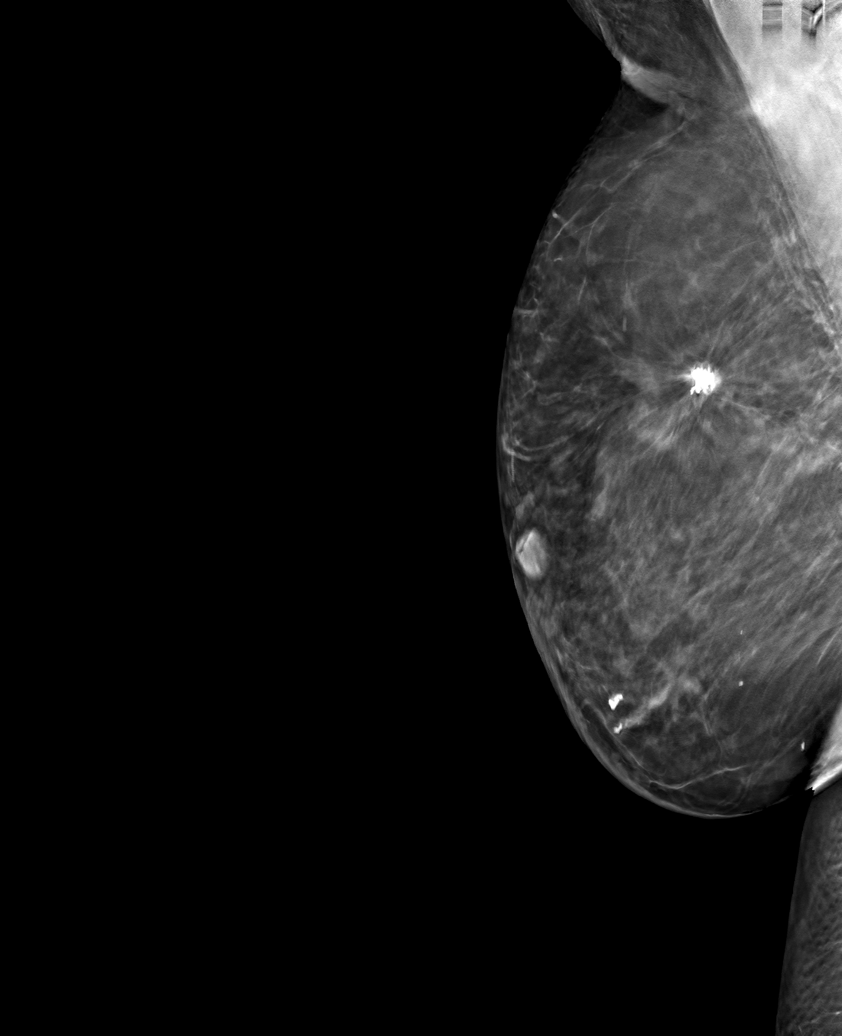

[6 of 30 positions shown; findings below may reference images not displayed]

ACR Breast Density Category b: There are scattered areas of
fibroglandular density.
FINDINGS: There are no findings suspicious for malignancy.
IMPRESSION: No mammographic evidence of malignancy. A result letter of this
screening mammogram will be mailed directly to the patient.

RECOMMENDATION:
Screening mammogram in one year. (Code:51-O-LD2)

BI-RADS CATEGORY  1: Negative.

## 2023-04-09 ENCOUNTER — Encounter: Payer: Self-pay | Admitting: Gastroenterology

## 2023-04-17 ENCOUNTER — Encounter: Payer: Self-pay | Admitting: Gastroenterology

## 2023-05-09 ENCOUNTER — Other Ambulatory Visit: Payer: Self-pay | Admitting: Dentistry

## 2023-05-09 DIAGNOSIS — M26632 Articular disc disorder of left temporomandibular joint: Secondary | ICD-10-CM

## 2023-05-12 ENCOUNTER — Encounter: Payer: Self-pay | Admitting: Dentistry

## 2023-05-14 ENCOUNTER — Ambulatory Visit
Admission: RE | Admit: 2023-05-14 | Discharge: 2023-05-14 | Disposition: A | Discharge status: Home / Self Care | Payer: Medicare Other | Source: Ambulatory Visit | Attending: Dentistry | Admitting: Dentistry

## 2023-05-14 DIAGNOSIS — M26632 Articular disc disorder of left temporomandibular joint: Secondary | ICD-10-CM

## 2023-05-19 ENCOUNTER — Ambulatory Visit (AMBULATORY_SURGERY_CENTER): Payer: Medicare Other

## 2023-05-19 VITALS — Ht 66.5 in | Wt 169.0 lb

## 2023-05-19 DIAGNOSIS — Z860100 Personal history of colon polyps, unspecified: Secondary | ICD-10-CM

## 2023-05-19 MED ORDER — NA SULFATE-K SULFATE-MG SULF 17.5-3.13-1.6 GM/177ML PO SOLN
1.0000 | Freq: Once | ORAL | 0 refills | Status: AC
Start: 2023-05-19 — End: 2023-05-19

## 2023-05-19 NOTE — Progress Notes (Signed)

## 2023-06-13 ENCOUNTER — Ambulatory Visit: Payer: Medicare Other | Admitting: Gastroenterology

## 2023-06-13 ENCOUNTER — Encounter: Payer: Self-pay | Admitting: Gastroenterology

## 2023-06-13 VITALS — BP 165/71 | HR 63 | Temp 97.7°F | Resp 16 | Ht 66.5 in | Wt 169.0 lb

## 2023-06-13 DIAGNOSIS — Z860100 Personal history of colon polyps, unspecified: Secondary | ICD-10-CM

## 2023-06-13 DIAGNOSIS — K648 Other hemorrhoids: Secondary | ICD-10-CM

## 2023-06-13 DIAGNOSIS — D175 Benign lipomatous neoplasm of intra-abdominal organs: Secondary | ICD-10-CM

## 2023-06-13 DIAGNOSIS — D123 Benign neoplasm of transverse colon: Secondary | ICD-10-CM

## 2023-06-13 DIAGNOSIS — K5730 Diverticulosis of large intestine without perforation or abscess without bleeding: Secondary | ICD-10-CM | POA: Diagnosis not present

## 2023-06-13 DIAGNOSIS — Z860101 Personal history of adenomatous and serrated colon polyps: Secondary | ICD-10-CM

## 2023-06-13 DIAGNOSIS — Z1211 Encounter for screening for malignant neoplasm of colon: Secondary | ICD-10-CM

## 2023-06-13 MED ORDER — SODIUM CHLORIDE 0.9 % IV SOLN
500.0000 mL | INTRAVENOUS | Status: DC
Start: 2023-06-13 — End: 2023-06-13

## 2023-06-13 NOTE — Progress Notes (Signed)
 A/O x 3, gd SR's, VSS, report to RN

## 2023-06-13 NOTE — Progress Notes (Signed)
 Patient states there have been no changes to medical or surgical history since time of pre-visit.

## 2023-06-13 NOTE — Op Note (Signed)
 [facility] Patient Name: Jill Mcguire Procedure Date: 06/13/2023 12:44 PM MRN: [id number] Endoscopist: Sherilyn Cooter L. Myrtie Neither , MD, [id number] Age: 73 Referring MD:  Date of Birth: [birth year] Gender: Female Account #: [id number] Procedure:                Colonoscopy Indications:              Surveillance: Personal history of adenomatous                            polyps on last colonoscopy 3 years ago                           2 diminutive tubular adenomas January 2022                           > 10 tubular adenomas December 2020 Medicines:                Monitored Anesthesia Care Procedure:                Pre-Anesthesia Assessment:                           - Prior to the procedure, a History and Physical                            was performed, and patient medications and                            allergies were reviewed. The patient's tolerance of                            previous anesthesia was also reviewed. The risks                            and benefits of the procedure and the sedation                            options and risks were discussed with the patient.                            All questions were answered, and informed consent                            was obtained. Prior Anticoagulants: The patient has                            taken no anticoagulant or antiplatelet agents. ASA                            Grade Assessment: II - A patient with mild systemic                            disease. After reviewing the risks and benefits,  the patient was deemed in satisfactory condition to                            undergo the procedure.                           After obtaining informed consent, the colonoscope                            was passed under direct vision. Throughout the                            procedure, the patient's blood pressure, pulse, and                            oxygen saturations were monitored  continuously. The                            CF [id number] #[id number] was introduced through the anus                            and advanced to the the cecum, identified by                            appendiceal orifice and ileocecal valve. The                            colonoscopy was performed without difficulty. The                            patient tolerated the procedure well. The quality                            of the bowel preparation was excellent. The                            ileocecal valve, appendiceal orifice, and rectum                            were photographed. Scope In: 1:21:58 PM Scope Out: 1:33:10 PM Scope Withdrawal Time: 0 hours 8 minutes 43 seconds  Total Procedure Duration: 0 hours 11 minutes 12 seconds  Findings:                 The perianal and digital rectal examinations were                            normal.                           Repeat examination of right colon under NBI                            performed.  There was a lipoma, in the ascending colon.                           A diminutive polyp was found in the transverse                            colon. The polyp was sessile. The polyp was removed                            with a cold snare. Resection and retrieval were                            complete.                           Diverticula were found in the sigmoid colon.                           Internal hemorrhoids were found. The hemorrhoids                            were small.                           The exam was otherwise without abnormality on                            direct and retroflexion views. Complications:            No immediate complications. Estimated Blood Loss:     Estimated blood loss was minimal. Impression:               - Lipoma in the ascending colon.                           - One diminutive polyp in the transverse colon,                            removed with a cold snare. Resected  and retrieved.                           - Diverticulosis in the sigmoid colon.                           - Internal hemorrhoids.                           - The examination was otherwise normal on direct                            and retroflexion views. Recommendation:           - Patient has a contact number available for                            emergencies. The signs and symptoms of potential  delayed complications were discussed with the                            patient. Return to normal activities tomorrow.                            Written discharge instructions were provided to the                            patient.                           - Resume previous diet.                           - Continue present medications.                           - Await pathology results.                           - Repeat colonoscopy is recommended for                            surveillance. The colonoscopy date will be                            determined after pathology results from today's                            exam become available for review. Cyree Chuong L. Myrtie Neither, MD 06/13/2023 1:41:42 PM This report has been signed electronically.

## 2023-06-13 NOTE — Patient Instructions (Signed)
 Thank you for letting us care for your healthcare needs today! Please see handouts regarding Polyps, Diverticulosis and Hemorrhoids. Await pathology results.   YOU HAD AN ENDOSCOPIC PROCEDURE TODAY AT THE [facility]:   Refer to the procedure report that was given to you for any specific questions about what was found during the examination.  If the procedure report does not answer your questions, please call your gastroenterologist to clarify.  If you requested that your care partner not be given the details of your procedure findings, then the procedure report has been included in a sealed envelope for you to review at your convenience later.  YOU SHOULD EXPECT: Some feelings of bloating in the abdomen. Passage of more gas than usual.  Walking can help get rid of the air that was put into your GI tract during the procedure and reduce the bloating. If you had a lower endoscopy (such as a colonoscopy or flexible sigmoidoscopy) you may notice spotting of blood in your stool or on the toilet paper. If you underwent a bowel prep for your procedure, you may not have a normal bowel movement for a few days.  Please Note:  You might notice some irritation and congestion in your nose or some drainage.  This is from the oxygen used during your procedure.  There is no need for concern and it should clear up in a day or so.  SYMPTOMS TO REPORT IMMEDIATELY:  Following lower endoscopy (colonoscopy or flexible sigmoidoscopy):  Excessive amounts of blood in the stool  Significant tenderness or worsening of abdominal pains  Swelling of the abdomen that is new, acute  Fever of 100F or higher  For urgent or emergent issues, a gastroenterologist can be reached at any hour by calling [phone]. Do not use MyChart messaging for urgent concerns.    DIET:  We do recommend a small meal at first, but then you may proceed to your regular diet.  Drink plenty of fluids but you should avoid alcoholic  beverages for 24 hours.  ACTIVITY:  You should plan to take it easy for the rest of today and you should NOT DRIVE or use heavy machinery until tomorrow (because of the sedation medicines used during the test).    FOLLOW UP: Our staff will call the number listed on your records the next business day following your procedure.  We will call around 7:15- 8:00 am to check on you and address any questions or concerns that you may have regarding the information given to you following your procedure. If we do not reach you, we will leave a message.     If any biopsies were taken you will be contacted by phone or by letter within the next 1-3 weeks.  Please call us at [phone] if you have not heard about the biopsies in 3 weeks.    SIGNATURES/CONFIDENTIALITY: You and/or your care partner have signed paperwork which will be entered into your electronic medical record.  These signatures attest to the fact that that the information above on your After Visit Summary has been reviewed and is understood.  Full responsibility of the confidentiality of this discharge information lies with you and/or your care-partner.

## 2023-06-13 NOTE — Progress Notes (Signed)
 Called to room to assist during endoscopic procedure.  Patient ID and intended procedure confirmed with present staff. Received instructions for my participation in the procedure from the performing physician.

## 2023-06-13 NOTE — Progress Notes (Signed)
 History and Physical:  This patient presents for endoscopic testing for: Encounter Diagnosis  Name Primary?   Hx of colonic polyps Yes    73 year old woman here today for surveillance colonoscopy.  In December 2020, she had over 10 tubular adenomas removed. 2 diminutive tubular adenomas were removed in January 2022. Patient denies chronic abdominal pain, rectal bleeding, constipation or diarrhea.   Patient is otherwise without complaints or active issues today.   Past Medical History: Past Medical History:  Diagnosis Date   Allergy    Anxiety    Arthritis    Breast CA (HCC) 2000   right breast   Cataract    bilateral lens implants   Cough 05/22/2014   states drainage from head- "when I lay down- drainage is clear and no fever"   Hyperlipidemia    Hypertension    Personal history of chemotherapy 2000   Right Breast Cancer   Personal history of radiation therapy 2000   Right Breast Cancer   PONV (postoperative nausea and vomiting)    Sleep apnea    no c-pap     Past Surgical History: Past Surgical History:  Procedure Laterality Date   ANTERIOR AND POSTERIOR REPAIR N/A 01/26/2013   Procedure: ANTERIOR (CYSTOCELE) AND POSTERIOR REPAIR (RECTOCELE);  Surgeon: Tilda Burrow, MD;  Location: [facility];  Service: Gynecology;  Laterality: N/A;   anterior cervical laminectomy  1996   APPENDECTOMY  1982   BREAST BIOPSY Right 1994, 2000   BREAST LUMPECTOMY Right 2001   lymph node dissection   COLONOSCOPY     EYE SURGERY Right    "muscle resection"/ cataract extraction with IOL   LUMBAR DISC SURGERY  2008   L4-L5   LUMBAR LAMINECTOMY/DECOMPRESSION MICRODISCECTOMY N/A 05/18/2014   Procedure: MICRO LUMBER DECOMPRESSION L4-5;  Surgeon: Javier Docker, MD;  Location: [facility];  Service: Orthopedics;  Laterality: N/A;   OOPHORECTOMY Left 01/26/2013   Procedure: OOPHORECTOMY;  Surgeon: Tilda Burrow, MD;  Location: [facility];  Service: Gynecology;  Laterality: Left;   right eye muscel  dissection  1995   SALPINGOOPHORECTOMY  1982   right   VAGINAL HYSTERECTOMY N/A 01/26/2013   Procedure: HYSTERECTOMY VAGINAL;  Surgeon: Tilda Burrow, MD;  Location: [facility];  Service: Gynecology;  Laterality: N/A;    Allergies: Allergies  Allergen Reactions   Darvocet [Propoxyphene N-Acetaminophen] Rash   Epinephrine     Rapid heart rate, decreased BP after local anesthetic injection to skin 30 yrs ago Tolerated Marcaine&Epi completely 01/26/13 for Ant & Post Vag repair.   Tagamet [Cimetidine] Rash   Tetanus Toxoids Rash    Outpatient Meds: Current Outpatient Medications  Medication Sig Dispense Refill   amLODipine (NORVASC) 10 MG tablet Take 10 mg by mouth every morning.     buPROPion (WELLBUTRIN XL) 150 MG 24 hr tablet Take 150 mg by mouth every morning.     DULoxetine (CYMBALTA) 30 MG capsule Take 30 mg by mouth daily.     pravastatin (PRAVACHOL) 20 MG tablet Take 20 mg by mouth daily.     telmisartan (MICARDIS) 40 MG tablet Take 40 mg by mouth daily.     acetaminophen (TYLENOL) 500 MG tablet Take 1,000 mg by mouth every 6 (six) hours as needed for mild pain (pain score 1-3).     Ascorbic Acid (VITAMIN C) 1000 MG tablet Take 1,000 mg by mouth daily. (Patient not taking: Reported on 04/21/2020)     Biotin 1000 MCG tablet Take 1,000 mcg by mouth daily. (  Patient not taking: Reported on 05/19/2023)     cyclobenzaprine (FLEXERIL) 10 MG tablet Take 10 mg by mouth at bedtime.     docusate sodium (COLACE) 100 MG capsule Take 1 capsule (100 mg total) by mouth 2 (two) times daily as needed for mild constipation. 20 capsule 1   fluticasone (FLONASE) 50 MCG/ACT nasal spray Place 2 sprays into both nostrils daily.     polyethylene glycol powder (GLYCOLAX/MIRALAX) 17 GM/SCOOP powder Take 1 Container by mouth daily as needed.     Current Facility-Administered Medications  Medication Dose Route Frequency Provider Last Rate Last Admin   0.9 %  sodium chloride infusion  500 mL Intravenous  Continuous Danis, Starr Lake III, MD          ___________________________________________________________________ Objective   Exam:  BP (!) 122/51   Pulse 68   Temp 97.7 F (36.5 C) (Skin)   Ht 5' 6.5" (1.689 m)   Wt 169 lb (76.7 kg)   SpO2 100%   BMI 26.87 kg/m   CV: regular , S1/S2 Resp: clear to auscultation bilaterally, normal RR and effort noted GI: soft, no tenderness, with active bowel sounds.   Assessment: Encounter Diagnosis  Name Primary?   Hx of colonic polyps Yes     Plan: Colonoscopy   The benefits and risks of the planned procedure(s) were described in detail with the patient or (when appropriate) their health care proxy.  Risks were outlined as including, but not limited to, bleeding, infection, perforation, adverse medication reaction leading to cardiac or pulmonary decompensation, pancreatitis (if ERCP).  The limitation of incomplete mucosal visualization was also discussed.  No guarantees or warranties were given.  The patient is appropriate for an endoscopic procedure in the ambulatory setting.   - Amada Jupiter, MD

## 2023-06-16 ENCOUNTER — Telehealth: Payer: Self-pay | Admitting: *Deleted

## 2023-06-16 NOTE — Telephone Encounter (Signed)
  Follow up Call-     06/13/2023   12:54 PM  Call back number  Post procedure Call Back phone  # [phone]  Permission to leave phone message Yes    Left message on machine if any needs or questions

## 2023-06-19 LAB — SURGICAL PATHOLOGY

## 2023-06-23 ENCOUNTER — Encounter: Payer: Self-pay | Admitting: Gastroenterology

## 2023-11-14 ENCOUNTER — Other Ambulatory Visit: Payer: Self-pay | Admitting: Internal Medicine

## 2023-11-14 DIAGNOSIS — Z1231 Encounter for screening mammogram for malignant neoplasm of breast: Secondary | ICD-10-CM

## 2023-12-15 ENCOUNTER — Ambulatory Visit
Admission: RE | Admit: 2023-12-15 | Discharge: 2023-12-15 | Disposition: A | Discharge status: Home / Self Care | Source: Ambulatory Visit | Attending: Internal Medicine | Admitting: Internal Medicine

## 2023-12-15 DIAGNOSIS — Z1231 Encounter for screening mammogram for malignant neoplasm of breast: Secondary | ICD-10-CM
